# Patient Record
Sex: Male | Born: 1970 | Race: White | Hispanic: No | Marital: Single
Health system: Midwestern US, Community
[De-identification: ages and names within clinical notes are randomized; demographics above are authoritative.]

## PROBLEM LIST (undated history)

## (undated) DIAGNOSIS — S53106A Unspecified dislocation of unspecified ulnohumeral joint, initial encounter: Secondary | ICD-10-CM

## (undated) DIAGNOSIS — S42009A Fracture of unspecified part of unspecified clavicle, initial encounter for closed fracture: Secondary | ICD-10-CM

## (undated) HISTORY — PX: OTHER SURGICAL HISTORY: SHX169

---

## 2012-07-12 ENCOUNTER — Ambulatory Visit (INDEPENDENT_AMBULATORY_CARE_PROVIDER_SITE_OTHER): Payer: BC Managed Care – PPO | Admitting: Emergency Medicine

## 2012-07-12 ENCOUNTER — Telehealth: Payer: Self-pay

## 2012-07-12 VITALS — BP 126/72 | HR 89 | Temp 100.0°F | Resp 16 | Ht 73.0 in | Wt 226.0 lb

## 2012-07-12 DIAGNOSIS — A088 Other specified intestinal infections: Secondary | ICD-10-CM

## 2012-07-12 DIAGNOSIS — J209 Acute bronchitis, unspecified: Secondary | ICD-10-CM

## 2012-07-12 MED ORDER — HYDROCOD POLST-CHLORPHEN POLST 10-8 MG/5ML PO LQCR: 5.0000 mL | Freq: Two times a day (BID) | ORAL | Status: DC | PRN

## 2012-07-12 MED ORDER — AZITHROMYCIN 250 MG PO TABS: ORAL_TABLET | ORAL | Status: DC

## 2012-07-12 NOTE — Patient Instructions (Addendum)

## 2012-07-12 NOTE — Telephone Encounter (Signed)
Pharmacist from [facility] called and reported they did not get the Rx for Abx sent. I gave him Rx info over the phone because the Rx had been sent to the [facility] on [address] in error. I am correcting it in EPIC.

## 2012-07-12 NOTE — Progress Notes (Signed)
[facility] [address], [place] [address] [phone]  Date:  07/12/2012   Name:  Dennis Eaton   DOB:  06-Apr-1970   MRN:  [id number]  PCP:  No PCP Per Patient    Chief Complaint: Nasal Congestion, Diarrhea and Cough   History of Present Illness:  Dennis Eaton is a 42 y.o. very pleasant male patient who presents with the following:  Ill for a week with post nasal drainage and a cough productive of purulent sputum.  No wheezing or shortness of breath.  No fever but chilled feeling. Has diarrhea that ended last night since Sunday.  No nausea or vomiting. No ill contacts. The patient has no complaint of blood, mucous, or pus in her stools. Tolerating extra liquids.  No improvement with over the counter medications or other home remedies. Denies other complaint or health concern today.   There is no problem list on file for this patient.   History reviewed. No pertinent past medical history.  History reviewed. No pertinent past surgical history.  History  Substance Use Topics  . Smoking status: Never Smoker   . Smokeless tobacco: Not on file  . Alcohol Use: Yes    History reviewed. No pertinent family history.  No Known Allergies  Medication list has been reviewed and updated.  No current outpatient prescriptions on file prior to visit.   No current facility-administered medications on file prior to visit.    Review of Systems:  As per HPI, otherwise negative.    Physical Examination: Filed Vitals:   07/12/12 1504  BP: 126/72  Pulse: 89  Temp: 100 F (37.8 C)  Resp: 16   Filed Vitals:   07/12/12 1504  Height: 6\' 1"  (1.854 m)  Weight: 226 lb (102.513 kg)   Body mass index is 29.82 kg/(m^2). Ideal Body Weight: Weight in (lb) to have BMI = 25: 189.1  GEN: WDWN, NAD, Non-toxic, A & O x 3 HEENT: Atraumatic, Normocephalic. Neck supple. No masses, No LAD. Ears and Nose: No external deformity. CV: RRR, No M/G/R. No JVD. No thrill. No  extra heart sounds. PULM: CTA B, no wheezes, crackles, rhonchi. No retractions. No resp. distress. No accessory muscle use. ABD: S, NT, ND, +BS. No rebound. No HSM. EXTR: No c/c/e NEURO Normal gait.  PSYCH: Normally interactive. Conversant. Not depressed or anxious appearing.  Calm demeanor.    Assessment and Plan: Bronchitis Viral gastroenteritis zpak Gastroenteritis  Signed,  Phillips Odor, MD

## 2012-07-18 ENCOUNTER — Ambulatory Visit (INDEPENDENT_AMBULATORY_CARE_PROVIDER_SITE_OTHER): Payer: BC Managed Care – PPO | Admitting: Emergency Medicine

## 2012-07-18 VITALS — BP 114/72 | HR 64 | Temp 98.4°F | Resp 16 | Ht 73.0 in | Wt 227.0 lb

## 2012-07-18 DIAGNOSIS — J209 Acute bronchitis, unspecified: Secondary | ICD-10-CM

## 2012-07-18 NOTE — Progress Notes (Signed)
[facility] [address], [place] [address] [phone]  Date:  07/18/2012   Name:  Dennis Eaton   DOB:  [birth year]   MRN:  [id number]  PCP:  No PCP Per Patient    Chief Complaint: Follow-up, Hernia and Allergies   History of Present Illness:  Dennis Eaton is a 42 y.o. very pleasant male patient who presents with the following:  Seen a week ago and treated with zithromax and tussionex.  Finished the antibiotic and concerned that he still has a cough.  No further fever or chills. No wheezing or shortness of breath.  No nausea or vomiting.  No stool change.  Cough not productive and improved with meds.  Denies other complaint or health concern today.   There is no problem list on file for this patient.   No past medical history on file.  No past surgical history on file.  History  Substance Use Topics  . Smoking status: Never Smoker   . Smokeless tobacco: Not on file  . Alcohol Use: Yes    No family history on file.  No Known Allergies  Medication list has been reviewed and updated.  Current Outpatient Prescriptions on File Prior to Visit  Medication Sig Dispense Refill  . chlorpheniramine-HYDROcodone (TUSSIONEX PENNKINETIC ER) 10-8 MG/5ML LQCR Take 5 mLs by mouth every 12 (twelve) hours as needed.  60 mL  0  . azithromycin (ZITHROMAX) 250 MG tablet Take 2 tabs PO x 1 dose, then 1 tab PO QD x 4 days  6 tablet  0   No current facility-administered medications on file prior to visit.    Review of Systems:  As per HPI, otherwise negative.    Physical Examination: Filed Vitals:   07/18/12 1328  BP: 114/72  Pulse: 64  Temp: 98.4 F (36.9 C)  Resp: 16   Filed Vitals:   07/18/12 1328  Height: 6\' 1"  (1.854 m)  Weight: 227 lb (102.967 kg)   Body mass index is 29.96 kg/(m^2). Ideal Body Weight: Weight in (lb) to have BMI = 25: 189.1  GEN: WDWN, NAD, Non-toxic, A & O x 3 HEENT: Atraumatic, Normocephalic. Neck supple. No masses, No  LAD. Ears and Nose: No external deformity. CV: RRR, No M/G/R. No JVD. No thrill. No extra heart sounds. PULM: CTA B, no wheezes, crackles, rhonchi. No retractions. No resp. distress. No accessory muscle use. ABD: S, NT, ND, +BS. No rebound. No HSM. EXTR: No c/c/e NEURO Normal gait.  PSYCH: Normally interactive. Conversant. Not depressed or anxious appearing.  Calm demeanor.    Assessment and Plan: Bronchitis Continue medication Follow up as needed   Signed,  Phillips Odor, MD

## 2012-12-05 ENCOUNTER — Ambulatory Visit (INDEPENDENT_AMBULATORY_CARE_PROVIDER_SITE_OTHER): Payer: BC Managed Care – PPO | Admitting: Sports Medicine

## 2012-12-05 VITALS — BP 137/85 | Ht 73.5 in | Wt 228.0 lb

## 2012-12-05 DIAGNOSIS — M79662 Pain in left lower leg: Secondary | ICD-10-CM

## 2012-12-05 DIAGNOSIS — M79669 Pain in unspecified lower leg: Secondary | ICD-10-CM | POA: Insufficient documentation

## 2012-12-05 DIAGNOSIS — M79609 Pain in unspecified limb: Secondary | ICD-10-CM

## 2012-12-05 NOTE — Progress Notes (Signed)
  Subjective:    Patient ID: Dennis Eaton, male    DOB: [birth year], 42 y.o.   MRN: [id number]  HPI  Pt presents to clinic for evaluation of Lt AT and calf pain since the end of July.  Hx of calf injury 10 years ago. Running 3 times per week 3 miles. Works at an office type job.  Running course sometimes hilly  No swelling    Review of Systems     Objective:   Physical Exam  No direct ttp bilat calf Calves look symmetrical Leg lengths equal Alignment good   Mildly increased arch bilat Slight hallux valgus bilat great toes Slight Morton's change bilat Good post tib function bilat Great toe motion normal bilat   Running gait Neutral Midfoot strike Less push off on lt       Assessment & Plan:

## 2012-12-05 NOTE — Assessment & Plan Note (Signed)
HEP  Heel lifts  Compression sleeve  Running gait is neutral and I don't think this is issue  May need to continue using sports insoles and heel lifts

## 2012-12-05 NOTE — Patient Instructions (Addendum)
Please do suggested exercises at least 3 times per week  Ok to ease back into running  Use calf sleeve for activity and 30 min - 1 hour afterwards  Use heel lifts in running shoes  Please follow up in 6 weeks  Thank you for seeing [place] today!

## 2013-01-17 ENCOUNTER — Ambulatory Visit (INDEPENDENT_AMBULATORY_CARE_PROVIDER_SITE_OTHER): Payer: BC Managed Care – PPO | Admitting: Sports Medicine

## 2013-01-17 ENCOUNTER — Encounter: Payer: Self-pay | Admitting: Sports Medicine

## 2013-01-17 VITALS — BP 135/83 | HR 81 | Ht 73.5 in | Wt 214.0 lb

## 2013-01-17 DIAGNOSIS — M79609 Pain in unspecified limb: Secondary | ICD-10-CM

## 2013-01-17 DIAGNOSIS — S86112A Strain of other muscle(s) and tendon(s) of posterior muscle group at lower leg level, left leg, initial encounter: Secondary | ICD-10-CM

## 2013-01-17 DIAGNOSIS — M79662 Pain in left lower leg: Secondary | ICD-10-CM

## 2013-01-17 DIAGNOSIS — S838X9A Sprain of other specified parts of unspecified knee, initial encounter: Secondary | ICD-10-CM

## 2013-01-17 MED ORDER — NITROGLYCERIN 0.2 MG/HR TD PT24: MEDICATED_PATCH | TRANSDERMAL | Status: DC

## 2013-01-17 NOTE — Assessment & Plan Note (Signed)
Primarily only with running  No sign of DVT

## 2013-01-17 NOTE — Patient Instructions (Addendum)
Dennis Eaton, it was nice seeing you again today.  Please continue to do the one leg heel lifts on the stairs with your left leg. (3 sets of 15 reps).  As well, we will start you on the nitroglycerin protocol to help increase healing in that area.    Nitroglycerin Protocol   Apply 1/4 nitroglycerin patch to affected area daily.  Change position of patch within the affected area every 24 hours.  You may experience a headache during the first 1-2 weeks of using the patch, these should subside.  If you experience headaches after beginning nitroglycerin patch treatment, you may take your preferred over the counter pain reliever.  Another side effect of the nitroglycerin patch is skin irritation or rash related to patch adhesive.  Please notify our office if you develop more severe headaches or rash, and stop the patch.  Tendon healing with nitroglycerin patch may require 12 to 24 weeks depending on the extent of injury.  Men should not use if taking Viagra, Cialis, or Levitra.   Do not use if you have migraines or rosacea.

## 2013-01-17 NOTE — Progress Notes (Signed)
Dennis Eaton is a 42 y.o. male who presents today for left calf pain f/u.  Pt was originally seen by Dr. Darrick Penna on 12/05/12 and dx with calf strain at that time.  He was given a compression sleeve, 3/16 heel lifts B/L, and eccentric calf exercises.  Since his visit, pt had improvement over about a two week period prior to going out on a track and having gradual pain, sharp, that presented after running one lap around the track.  Since that point, he has not been able to run, perform the exercises.  Pain is only with forceful plantarflexion but otherwise he does not notice it.  Denies any popping sensation or hearing this when he was running.  Denies any paresthesias down his leg as well.    Current Outpatient Prescriptions on File Prior to Visit  Medication Sig Dispense Refill  . azithromycin (ZITHROMAX) 250 MG tablet Take 2 tabs PO x 1 dose, then 1 tab PO QD x 4 days  6 tablet  0  . chlorpheniramine-HYDROcodone (TUSSIONEX PENNKINETIC ER) 10-8 MG/5ML LQCR Take 5 mLs by mouth every 12 (twelve) hours as needed.  60 mL  0   No current facility-administered medications on file prior to visit.    ROS: Per HPI.  All other systems reviewed and are negative.   Physical Exam Filed Vitals:   01/17/13 0927  BP: 135/83  Pulse: 81    Physical Examination: General appearance - alert, well appearing, and in no distress Left Calf - No gross deformity or edema.  No direct TTP, 5/5 MS with negative Thompson test, + 2 DTR LE  Distally neurovascular intact   MSK [place] L calf, long and short axis obtained - hypoechoic 1 x 1 cm region around the gastroc/soleus junction with the achilles around the posterior aspect.  Achilles visualized and intact at the calcaneal junction

## 2013-01-17 NOTE — Assessment & Plan Note (Addendum)
Pt with visualized hypoechoic 1 x 1 cm region around the gastroc/soleus junction with the achilles around the posterior aspect.  He has been using the compression sleeve. Until 2 weeks ago he was performing the calf eccentric exercises, and ice after activity.  We will restart these along with starting the NTG protocol.  Will f/u in around 6 weeks to see how he is doing and consider performing repeat US to evaluate for improvement.    We added a higher heel lift to his insoles which look good.

## 2013-02-20 ENCOUNTER — Encounter: Payer: Self-pay | Admitting: Sports Medicine

## 2013-02-20 ENCOUNTER — Ambulatory Visit (INDEPENDENT_AMBULATORY_CARE_PROVIDER_SITE_OTHER): Payer: BC Managed Care – PPO | Admitting: Sports Medicine

## 2013-02-20 VITALS — BP 117/72 | Ht 73.5 in | Wt 214.0 lb

## 2013-02-20 DIAGNOSIS — M79662 Pain in left lower leg: Secondary | ICD-10-CM

## 2013-02-20 DIAGNOSIS — M79609 Pain in unspecified limb: Secondary | ICD-10-CM

## 2013-02-20 NOTE — Patient Instructions (Signed)
-  Continue Nitroglycerin patches over the mid calf at the point of maximum pain - Continue calf raises with leg straight and bend 45 degree continue 3 sets of 15 reps and advance weight as tolerated - Start running for 20 minutes total breakup into 5-10 minute intervals. Start 1-2 times per week and increase as tolerated

## 2013-02-20 NOTE — Progress Notes (Signed)
Patient ID: Dennis Eaton, male   DOB: 10-03-1970, 42 y.o.   MRN: [id number]  SUBJECTIVE:  Dennis Eaton is a 42 y.o. male who presents today for f/u left calf pain. Was previously dx with calf strain. He was given a compression sleeve, 3/16 heel lifts B/L, and eccentric calf exercises. He also was started on the nitroglycerin protocol over the site of medial gastrocnemius tear seen on [place] previously.  Since his visit, pt had improvement over. Since that point, he has not tried to return to run, but has been able to increase is reps on heel raises. Pain is only with forceful plantarflexion but otherwise he does not notice it. Denies any popping sensation or hearing this when he was running. Denies any paresthesias down his leg as well.   ROS: negative except what is stated in HIP  OBJECTIVE: BP 117/72 wt 214 ht 6' 1.5'' Physical Examination: General appearance - alert, well appearing, and in no distress  Left Calf - No gross deformity or edema. Mild direct TTP over the medial gastrocnemius junction, 5/5 MS with negative Thompson test, + 2 DTR LE  Distally neurovascular intact  MSK [place] L calf, long and short axis obtained - resolving of the hypoechoic region around the gastroc/soleus junction of the medial gastrocnemius head long the lateral midline. Achilles visualized and intact at the calcaneal junction    ASSESSMENT: Medial gastrocnemius muscle tear along the lateral midline - improved  PLAN: - Continue Nitroglycerin protocol - Continue calf raises with leg straight and bend 45 degree continue 3 sets of 15 reps and advance weight as tolerated - Start running for 20 minutes total breakup into 5-10 minute intervals. Start 1-2 times per week and increase as tolerated - Follow up in 6-8 weeks

## 2014-04-20 ENCOUNTER — Emergency Department (HOSPITAL_COMMUNITY): Payer: BLUE CROSS/BLUE SHIELD

## 2014-04-20 ENCOUNTER — Emergency Department (HOSPITAL_COMMUNITY)
Admission: EM | Admit: 2014-04-20 | Discharge: 2014-04-20 | Disposition: A | Discharge status: Home / Self Care | Payer: BLUE CROSS/BLUE SHIELD | Attending: Emergency Medicine | Admitting: Emergency Medicine

## 2014-04-20 ENCOUNTER — Encounter (HOSPITAL_COMMUNITY): Payer: Self-pay | Admitting: Emergency Medicine

## 2014-04-20 DIAGNOSIS — W19XXXA Unspecified fall, initial encounter: Secondary | ICD-10-CM

## 2014-04-20 DIAGNOSIS — Y9285 Railroad track as the place of occurrence of the external cause: Secondary | ICD-10-CM | POA: Diagnosis not present

## 2014-04-20 DIAGNOSIS — Y9355 Activity, bike riding: Secondary | ICD-10-CM | POA: Diagnosis not present

## 2014-04-20 DIAGNOSIS — Y998 Other external cause status: Secondary | ICD-10-CM | POA: Insufficient documentation

## 2014-04-20 DIAGNOSIS — IMO0001 Reserved for inherently not codable concepts without codable children: Secondary | ICD-10-CM

## 2014-04-20 DIAGNOSIS — S42401A Unspecified fracture of lower end of right humerus, initial encounter for closed fracture: Secondary | ICD-10-CM | POA: Diagnosis not present

## 2014-04-20 DIAGNOSIS — Z79899 Other long term (current) drug therapy: Secondary | ICD-10-CM | POA: Diagnosis not present

## 2014-04-20 DIAGNOSIS — S59901A Unspecified injury of right elbow, initial encounter: Secondary | ICD-10-CM | POA: Diagnosis present

## 2014-04-20 LAB — CBC WITH DIFFERENTIAL/PLATELET
BASOS PCT: 1 % (ref 0–1)
Basophils Absolute: 0 10*3/uL (ref 0.0–0.1)
Eosinophils Absolute: 0.1 10*3/uL (ref 0.0–0.7)
Eosinophils Relative: 1 % (ref 0–5)
HCT: 42.1 % (ref 39.0–52.0)
HEMOGLOBIN: 13.8 g/dL (ref 13.0–17.0)
Lymphocytes Relative: 18 % (ref 12–46)
Lymphs Abs: 1.3 10*3/uL (ref 0.7–4.0)
MCH: 30.3 pg (ref 26.0–34.0)
MCHC: 32.8 g/dL (ref 30.0–36.0)
MCV: 92.3 fL (ref 78.0–100.0)
MONOS PCT: 9 % (ref 3–12)
Monocytes Absolute: 0.6 10*3/uL (ref 0.1–1.0)
NEUTROS ABS: 5.1 10*3/uL (ref 1.7–7.7)
Neutrophils Relative %: 71 % (ref 43–77)
PLATELETS: 204 10*3/uL (ref 150–400)
RBC: 4.56 MIL/uL (ref 4.22–5.81)
RDW: 13.7 % (ref 11.5–15.5)
WBC: 7.1 10*3/uL (ref 4.0–10.5)

## 2014-04-20 LAB — BRAIN NATRIURETIC PEPTIDE: B NATRIURETIC PEPTIDE 5: 16.9 pg/mL (ref 0.0–100.0)

## 2014-04-20 MED ORDER — HYDROMORPHONE HCL 1 MG/ML IJ SOLN
1.0000 mg | Freq: Once | INTRAMUSCULAR | Status: AC
  Administered 2014-04-20: 1 mg via INTRAVENOUS
  Filled 2014-04-20: qty 1

## 2014-04-20 MED ORDER — KETAMINE HCL 10 MG/ML IJ SOLN
INTRAMUSCULAR | Status: AC | PRN
  Administered 2014-04-20: 200 mg via INTRAVENOUS

## 2014-04-20 MED ORDER — HYDROCODONE-ACETAMINOPHEN 5-325 MG PO TABS: 1.0000 | ORAL_TABLET | Freq: Four times a day (QID) | ORAL | Status: DC | PRN

## 2014-04-20 MED ORDER — SODIUM CHLORIDE 0.9 % IV BOLUS (SEPSIS)
1000.0000 mL | Freq: Once | INTRAVENOUS | Status: AC
  Administered 2014-04-20: 1000 mL via INTRAVENOUS

## 2014-04-20 MED ORDER — KETAMINE HCL 50 MG/ML IJ SOLN
200.0000 mg | Freq: Once | INTRAMUSCULAR | Status: DC
  Filled 2014-04-20: qty 4

## 2014-04-20 NOTE — ED Notes (Signed)
Bed: WA21 Expected date:  Expected time:  Means of arrival:  Comments: ? Elbow dislocation
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
## 2014-04-20 NOTE — ED Notes (Signed)
Ketamine wasted in sink with Patti, Cn present

## 2014-04-20 NOTE — Discharge Instructions (Signed)
You are noted to have a fracture and dislocation of the right elbow. We were able to reduce the elbow dislocation, however, you will have to see the Orthopedic surgeon to determine if surgery of the elbow will be needed or not. Take the meds prescribed.  Return to the Er immediately if there is severe pain in your arm.   Cast or Splint Care Casts and splints support injured limbs and keep bones from moving while they heal.  HOME CARE  Keep the cast or splint uncovered during the drying period.  A plaster cast can take 24 to 48 hours to dry.  A fiberglass cast will dry in less than 1 hour.  Do not rest the cast on anything harder than a pillow for 24 hours.  Do not put weight on your injured limb. Do not put pressure on the cast. Wait for your doctor's approval.  Keep the cast or splint dry.  Cover the cast or splint with a plastic bag during baths or wet weather.  If you have a cast over your chest and belly (trunk), take sponge baths until the cast is taken off.  If your cast gets wet, dry it with a towel or blow dryer. Use the cool setting on the blow dryer.  Keep your cast or splint clean. Wash a dirty cast with a damp cloth.  Do not put any objects under your cast or splint.  Do not scratch the skin under the cast with an object. If itching is a problem, use a blow dryer on a cool setting over the itchy area.  Do not trim or cut your cast.  Do not take out the padding from inside your cast.  Exercise your joints near the cast as told by your doctor.  Raise (elevate) your injured limb on 1 or 2 pillows for the first 1 to 3 days. GET HELP IF:  Your cast or splint cracks.  Your cast or splint is too tight or too loose.  You itch badly under the cast.  Your cast gets wet or has a soft spot.  You have a bad smell coming from the cast.  You get an object stuck under the cast.  Your skin around the cast becomes red or sore.  You have new or more pain after the  cast is put on. GET HELP RIGHT AWAY IF:  You have fluid leaking through the cast.  You cannot move your fingers or toes.  Your fingers or toes turn blue or white or are cool, painful, or puffy (swollen).  You have tingling or lose feeling (numbness) around the injured area.  You have bad pain or pressure under the cast.  You have trouble breathing or have shortness of breath.  You have chest pain. Document Released: 07/22/2010 Document Revised: 11/22/2012 Document Reviewed: 09/28/2012 [facility] Patient Information 2015 [place], [place]. This information is not intended to replace advice given to you by your health care provider. Make sure you discuss any questions you have with your health care provider.  Elbow Dislocation Elbow dislocation is the displacement of the bones that form the elbow joint. Three bones come together to form the elbow. The humerus is the bone in the upper arm. The radius and ulna are the 2 bones in the forearm that form the lower part of the elbow. The elbow is held in place by very strong, fibrous tissues (ligaments) that connect the bones to each other. CAUSES Elbow dislocations are not common. Typically, they occur  when a person falls forward with hands and elbows outstretched. The force of the impact is sent to the elbow. Usually, there is a twisting motion in this force. Elbow dislocations also happen during car crashes when passengers reach out to brace themselves during the impact. RISK FACTORS Although dislocation of the elbow can happen to anyone, some people are at greater risk than others. People at increased risk of elbow dislocation include:  People born with greater looseness in their ligaments.  People born with an ulna bone that has a shallow groove for the elbow hinge joint. SYMPTOMS Symptoms of a complete elbow dislocation usually are obvious. They include extreme pain and the appearance of a deformed arm.  Symptoms of a partial dislocation may  not be obvious. Your elbow may move somewhat, but you may have pain and swelling. Also, there will likely be bruising on the inside and outside of your elbow where ligaments have been stretched or torn.  DIAGNOSIS  To diagnose elbow dislocation, your caregiver will perform a physical exam. During this exam, your caregiver will check your arm for tenderness, swelling, and deformity. The skin around your arm and the circulation in your arm also will be checked. Your pulse will be checked at your wrist. If your artery is injured during dislocation, your hand will be cool to the touch and may be white or purple in color. Your caregiver also may check your arm and your ability to move your wrist and fingers to see if you had any damage to your nerves during dislocation. An X-ray exam also may be done to determine if there is bone injury. Results of an X-ray exam can help show the direction of the dislocation. If you have a simple dislocation, there is no major bone injury. If you have a complex dislocation, you may have broken bones (fractures) associated with the ligament injuries. TREATMENT For a simple elbow dislocation, your bones can usually be realigned in a procedure called a reduction. This is a treatment in which your bones are manually moved back into place either with the use of numbing medicine (regional anesthetic) around your elbow or medicine to make you sleep (general anesthetic). Then your elbow is kept immobile with a sling or a splint for 2 to 3 weeks. This is followed with physical therapy to help your joint move again. Complex elbow dislocation may require surgery to restore joint alignment and repair ligaments. After surgery, your elbow may be protected with an external hinge. This device keeps your elbow from dislocating again while motion exercises are done. Additional surgery may be needed to repair any injuries to blood vessels and nerves or bones and ligaments or to relieve pressure from  excessive swelling around the muscles. HOME CARE INSTRUCTIONS The following measures can help to reduce pain and hasten the healing process:  Rest your injured joint. Do not move it. Avoid activities similar to the one that caused your injury.  Exercise your hand and fingers as instructed by your caregiver.  Apply ice to your injured joint for 1 to 2 days after your reduction or as directed by your caregiver. Applying ice helps to reduce inflammation and pain.  Put ice in a plastic bag.  Place a towel between your skin and the bag.  Leave the ice on for 15 to 20 minutes at a time, every couple of hours while you are awake.  Elevate your arm above your heart and move your wrist and fingers as instructed by your caregiver  to help limit swelling.  Take over-the-counter or prescription medicines for pain as directed by your caregiver. SEEK IMMEDIATE MEDICAL CARE IF:  Your splint becomes damaged.  You have an external hinge and it becomes loose or will not move.  You have an external hinge and you develop drainage around the pins.  Your pain becomes worse rather than better.  You lose feeling in your hand or fingers. MAKE SURE YOU:  Understand these instructions.  Will watch your condition.  Will get help right away if you are not doing well or get worse. Document Released: 03/16/2001 Document Revised: 06/14/2011 Document Reviewed: 08/20/2010 [facility] Patient Information 2015 [place], [place]. This information is not intended to replace advice given to you by your health care provider. Make sure you discuss any questions you have with your health care provider.  Elbow Fracture, Simple A fracture is a break in one of the bones.When fractures are not displaced or separated, they may be treated with only a sling or splint. The sling or splint may only be required for two to three weeks. In these cases, often the elbow is put through early range of motion exercises to prevent the elbow  from getting stiff. DIAGNOSIS  The diagnosis (learning what is wrong) of a fractured elbow is made by x-ray. These may be required before and after the elbow is put into a splint or cast. X-rays are taken after to make sure the bone pieces have not moved. HOME CARE INSTRUCTIONS   Only take over-the-counter or prescription medicines for pain, discomfort, or fever as directed by your caregiver.  If you have a splint held on with an elastic wrap and your hand or fingers become numb or cold and blue, loosen the wrap and reapply more loosely. See your caregiver if there is no relief.  You may use ice for twenty minutes, four times per day, for the first two to three days.  Use your elbow as directed.  See your caregiver as directed. It is very important to keep all follow-up referrals and appointments in order to avoid any long-term problems with your elbow including chronic pain or stiffness. SEEK IMMEDIATE MEDICAL CARE IF:   There is swelling or increasing pain in elbow.  You begin to lose feeling or experience numbness or tingling in your hand or fingers.  You develop swelling of the hand and fingers.  You get a cold or blue hand or fingers on affected side. MAKE SURE YOU:   Understand these instructions.  Will watch your condition.  Will get help right away if you are not doing well or get worse. Document Released: 03/16/2001 Document Revised: 06/14/2011 Document Reviewed: 02/04/2009 [facility] Patient Information 2015 [place], [place]. This information is not intended to replace advice given to you by your health care provider. Make sure you discuss any questions you have with your health care provider.

## 2014-04-20 NOTE — ED Notes (Signed)
6 ml/300mg  Ketamine wasted in sink by [place], RCharity fundraiser
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
## 2014-04-20 NOTE — ED Notes (Signed)
MD bedside
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
## 2015-10-29 ENCOUNTER — Other Ambulatory Visit: Payer: Self-pay | Admitting: Orthopedic Surgery

## 2015-11-03 ENCOUNTER — Encounter (HOSPITAL_COMMUNITY): Payer: Self-pay

## 2015-11-03 ENCOUNTER — Encounter (HOSPITAL_COMMUNITY)
Admission: RE | Admit: 2015-11-03 | Discharge: 2015-11-03 | Disposition: A | Discharge status: Home / Self Care | Payer: BLUE CROSS/BLUE SHIELD | Source: Ambulatory Visit | Attending: Orthopedic Surgery | Admitting: Orthopedic Surgery

## 2015-11-03 DIAGNOSIS — S42001A Fracture of unspecified part of right clavicle, initial encounter for closed fracture: Secondary | ICD-10-CM | POA: Diagnosis present

## 2015-11-03 DIAGNOSIS — X58XXXA Exposure to other specified factors, initial encounter: Secondary | ICD-10-CM | POA: Diagnosis not present

## 2015-11-03 HISTORY — DX: Unspecified dislocation of unspecified ulnohumeral joint, initial encounter: S53.106A

## 2015-11-03 HISTORY — DX: Fracture of unspecified part of unspecified clavicle, initial encounter for closed fracture: S42.009A

## 2015-11-03 LAB — BASIC METABOLIC PANEL
ANION GAP: 4 — AB (ref 5–15)
BUN: 18 mg/dL (ref 6–20)
CALCIUM: 9.3 mg/dL (ref 8.9–10.3)
CO2: 27 mmol/L (ref 22–32)
Chloride: 107 mmol/L (ref 101–111)
Creatinine, Ser: 1.2 mg/dL (ref 0.61–1.24)
GFR calc non Af Amer: >60 mL/min (ref >60)
Glucose, Bld: 102 mg/dL — ABNORMAL HIGH (ref 65–99)
Potassium: 4.1 mmol/L (ref 3.5–5.1)
SODIUM: 138 mmol/L (ref 135–145)

## 2015-11-03 LAB — CBC
HCT: 42.3 % (ref 39.0–52.0)
HEMOGLOBIN: 13.7 g/dL (ref 13.0–17.0)
MCH: 29.7 pg (ref 26.0–34.0)
MCHC: 32.4 g/dL (ref 30.0–36.0)
MCV: 91.8 fL (ref 78.0–100.0)
PLATELETS: 237 10*3/uL (ref 150–400)
RBC: 4.61 MIL/uL (ref 4.22–5.81)
RDW: 13 % (ref 11.5–15.5)
WBC: 6.5 10*3/uL (ref 4.0–10.5)

## 2015-11-03 NOTE — Pre-Procedure Instructions (Signed)
Dennis Eaton  11/03/2015      Dennis Eaton [facility] [address] [place] [address] [place] [address] Phone: [phone] Fax: [phone]  Dennis Eaton [facility] [address] [place] - [address] [place] [address] Phone: [phone] Fax: [phone]    Your procedure is scheduled on Tuesday, November 04, 2015  Report to [facility] Admitting at 2:30 P.M.  Call this number if you have problems the morning of surgery:  [phone]   Remember:  Do not eat food or drink liquids after midnight.   Take these medicines the morning of surgery with A SIP OF WATER : None  Stop taking Aspirin,vitamins, fish oil and herbal medications. Do not take any NSAIDs ie: Ibuprofen, Advil, Naproxen, BC and Goody Powder or any medication containing Aspirin; stop now.   Do not wear jewelry.  Do not wear lotions, powders, or colonge.  You may not wear deoderant.   Men may shave face and neck.  Do not bring valuables to the hospital.  [facility] is not responsible for any belongings or valuables.  Contacts, dentures or bridgework may not be worn into surgery.  Leave your suitcase in the car.  After surgery it may be brought to your room.  For patients admitted to the hospital, discharge time will be determined by your treatment team.  Patients discharged the day of surgery will not be allowed to drive home.   Name and phone number of your driver:   Special instructions:  [facility] - Preparing for Surgery  Before surgery, you can play an important role.  Because skin is not sterile, your skin needs to be as free of germs as possible.  You can reduce the number of germs on you skin by washing with CHG (chlorahexidine gluconate) soap before surgery.  CHG is an antiseptic cleaner which kills germs and bonds with the skin to continue killing germs even after washing.  Please DO NOT use if you have an allergy to  CHG or antibacterial soaps.  If your skin becomes reddened/irritated stop using the CHG and inform your nurse when you arrive at Short Stay.  Do not shave (including legs and underarms) for at least 48 hours prior to the first CHG shower.  You may shave your face.  Please follow these instructions carefully:   1.  Shower with CHG Soap the night before surgery and the morning of Surgery.  2.  If you choose to wash your hair, wash your hair first as usual with your normal shampoo.  3.  After you shampoo, rinse your hair and body thoroughly to remove the Shampoo.  4.  Use CHG as you would any other liquid soap.  You can apply chg directly  to the skin and wash gently with scrungie or a clean washcloth.  5.  Apply the CHG Soap to your body ONLY FROM THE NECK DOWN.  Do not use on open wounds or open sores.  Avoid contact with your eyes, ears, mouth and genitals (private parts).  Wash genitals (private parts) with your normal soap.  6.  Wash thoroughly, paying special attention to the area where your surgery will be performed.  7.  Thoroughly rinse your body with warm water from the neck down.  8.  DO NOT shower/wash with your normal soap after using and rinsing off the CHG Soap.  9.  Pat yourself dry with a  clean towel.            10.  Wear clean pajamas.            11.  Place clean sheets on your bed the night of your first shower and do not sleep with pets.  Day of Surgery  Do not apply any lotions/deodorants the morning of surgery.  Please wear clean clothes to the hospital/surgery center.  Please read over the following fact sheets that you were given. Pain Booklet, Coughing and Deep Breathing, MRSA Information and Surgical Site Infection Prevention

## 2015-11-03 NOTE — Progress Notes (Signed)
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  He stated that he has the records at work and will go there and try to get them faxed over to [place] today.  Will follow up with patient this afternoon on these records    Patient denies shortness of breath, fever, cough and chest pain at PAT appointment

## 2015-11-03 NOTE — Progress Notes (Signed)
Anesthesia Chart Review: Patient is a 45 year old male scheduled for ORIF of right clavicular fracture on 11/04/15 by Dr. August Saucer.   History includes non-smoker. 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  He works for [employer]. He was able to get the Occupational Nurse there to fax records from [facility]. He was seen there on 07/13/11 by Dr. Tyrone Nine for evaluation of an abnormal EKG that showed "borderline first-degree AV block, left ward axis and the possibility of an old inferior infarction, age undetermined." EKG at that office showed "normal sinus rhythm, but normal AV conduction on QRS configuration of an incomplete and more likely innocent right bundle branch block.." Patient underwent an echocardiogram and treadmill test. According to 07/13/11 office note, "The echocardiogram reveals all chamber dimensions to be within normal limits. No significant valvular pathology. No signs of pericardial effusion, wall motion in all the chamber normal throughout. The treadmill test was performed following a modified Bruce protocol and the patient achieved 12.9 METs of exercise, which is an excellent aerobic functional capacity. No arrhythmias or signs of effort induced ischemia were seen. Again during the performance of the treadmill, the patient's electrocardigram continued to show the presence of borderline incomplete innocent right bundle branch block." Dr. Nicholes Mango stated, "...the cardiovascular examination of this patient should otherwise considered to be within normal limites at this times. The finders of the incomplete right bundle branch block by itself do not represent pathology."  11/03/15 EKG: SR with first degree AV block, possible LAE, right BBB. Right BBB pattern is more obvious in V1 with QRS when compared to 06/29/11 tracing received from [facility].  Preoperative labs noted.   Anticipate that he can proceed as planned.  Velna Ochs [facility] Short Stay  Center/Anesthesiology Phone [phone] 11/03/2015 3:16 PM

## 2015-11-04 ENCOUNTER — Ambulatory Visit (HOSPITAL_COMMUNITY): Payer: BLUE CROSS/BLUE SHIELD

## 2015-11-04 ENCOUNTER — Encounter (HOSPITAL_COMMUNITY): Payer: Self-pay | Admitting: Anesthesiology

## 2015-11-04 ENCOUNTER — Ambulatory Visit (HOSPITAL_COMMUNITY): Payer: BLUE CROSS/BLUE SHIELD | Admitting: Vascular Surgery

## 2015-11-04 ENCOUNTER — Ambulatory Visit (HOSPITAL_COMMUNITY)
Admission: RE | Admit: 2015-11-04 | Discharge: 2015-11-04 | Disposition: A | Discharge status: Home / Self Care | Payer: BLUE CROSS/BLUE SHIELD | Source: Ambulatory Visit | Attending: Orthopedic Surgery | Admitting: Orthopedic Surgery

## 2015-11-04 ENCOUNTER — Encounter (HOSPITAL_COMMUNITY)
Admission: RE | Disposition: A | Discharge status: Home / Self Care | Payer: Self-pay | Source: Ambulatory Visit | Attending: Orthopedic Surgery

## 2015-11-04 ENCOUNTER — Ambulatory Visit (HOSPITAL_COMMUNITY): Payer: BLUE CROSS/BLUE SHIELD | Admitting: Anesthesiology

## 2015-11-04 DIAGNOSIS — X58XXXA Exposure to other specified factors, initial encounter: Secondary | ICD-10-CM | POA: Insufficient documentation

## 2015-11-04 DIAGNOSIS — S42001A Fracture of unspecified part of right clavicle, initial encounter for closed fracture: Secondary | ICD-10-CM | POA: Insufficient documentation

## 2015-11-04 HISTORY — PX: ORIF CLAVICULAR FRACTURE: SHX5055

## 2015-11-04 SURGERY — OPEN REDUCTION INTERNAL FIXATION (ORIF) CLAVICULAR FRACTURE
Anesthesia: General | Site: Shoulder | Laterality: Right

## 2015-11-04 MED ORDER — MORPHINE SULFATE (PF) 4 MG/ML IV SOLN
INTRAVENOUS | Status: AC
  Filled 2015-11-04: qty 2

## 2015-11-04 MED ORDER — KETOROLAC TROMETHAMINE 30 MG/ML IJ SOLN
30.0000 mg | Freq: Once | INTRAMUSCULAR | Status: AC | PRN
  Administered 2015-11-04: 30 mg via INTRAVENOUS

## 2015-11-04 MED ORDER — LIDOCAINE HCL (CARDIAC) 20 MG/ML IV SOLN
INTRAVENOUS | Status: DC | PRN
  Administered 2015-11-04: 60 mg via INTRAVENOUS

## 2015-11-04 MED ORDER — CEFAZOLIN SODIUM-DEXTROSE 2-4 GM/100ML-% IV SOLN
INTRAVENOUS | Status: AC
  Filled 2015-11-04: qty 100

## 2015-11-04 MED ORDER — FENTANYL CITRATE (PF) 100 MCG/2ML IJ SOLN
INTRAMUSCULAR | Status: DC | PRN
  Administered 2015-11-04: 50 ug via INTRAVENOUS
  Administered 2015-11-04 (×2): 100 ug via INTRAVENOUS

## 2015-11-04 MED ORDER — HYDROMORPHONE HCL 1 MG/ML IJ SOLN
INTRAMUSCULAR | Status: AC
  Filled 2015-11-04: qty 1

## 2015-11-04 MED ORDER — LACTATED RINGERS IV SOLN
INTRAVENOUS | Status: DC | PRN
  Administered 2015-11-04 (×2): via INTRAVENOUS

## 2015-11-04 MED ORDER — OXYCODONE-ACETAMINOPHEN 5-325 MG PO TABS
ORAL_TABLET | ORAL | Status: AC
  Filled 2015-11-04: qty 1

## 2015-11-04 MED ORDER — LIDOCAINE HCL 4 % EX SOLN
CUTANEOUS | Status: DC | PRN
  Administered 2015-11-04: 2 mL via TOPICAL

## 2015-11-04 MED ORDER — ACETAMINOPHEN 10 MG/ML IV SOLN
INTRAVENOUS | Status: DC | PRN
  Administered 2015-11-04: 1000 mg via INTRAVENOUS

## 2015-11-04 MED ORDER — MIDAZOLAM HCL 2 MG/2ML IJ SOLN
INTRAMUSCULAR | Status: AC
  Filled 2015-11-04: qty 2

## 2015-11-04 MED ORDER — METHOCARBAMOL 500 MG PO TABS: 500.0000 mg | ORAL_TABLET | Freq: Four times a day (QID) | ORAL | 0 refills | Status: AC

## 2015-11-04 MED ORDER — CHLORHEXIDINE GLUCONATE 4 % EX LIQD: 60.0000 mL | Freq: Once | CUTANEOUS | Status: DC

## 2015-11-04 MED ORDER — ROCURONIUM BROMIDE 50 MG/5ML IV SOLN
INTRAVENOUS | Status: AC
  Filled 2015-11-04: qty 1

## 2015-11-04 MED ORDER — CEFAZOLIN SODIUM-DEXTROSE 2-4 GM/100ML-% IV SOLN
2.0000 g | INTRAVENOUS | Status: AC
  Administered 2015-11-04: 2 g via INTRAVENOUS

## 2015-11-04 MED ORDER — LACTATED RINGERS IV SOLN
INTRAVENOUS | Status: DC
  Administered 2015-11-04: 15:00:00 via INTRAVENOUS

## 2015-11-04 MED ORDER — ONDANSETRON HCL 4 MG/2ML IJ SOLN
INTRAMUSCULAR | Status: DC | PRN
  Administered 2015-11-04: 4 mg via INTRAVENOUS

## 2015-11-04 MED ORDER — FENTANYL CITRATE (PF) 250 MCG/5ML IJ SOLN
INTRAMUSCULAR | Status: AC
  Filled 2015-11-04: qty 5

## 2015-11-04 MED ORDER — BUPIVACAINE HCL (PF) 0.25 % IJ SOLN
INTRAMUSCULAR | Status: DC | PRN
  Administered 2015-11-04: 20 mL

## 2015-11-04 MED ORDER — MIDAZOLAM HCL 5 MG/5ML IJ SOLN
INTRAMUSCULAR | Status: DC | PRN
  Administered 2015-11-04: 2 mg via INTRAVENOUS

## 2015-11-04 MED ORDER — PROPOFOL 10 MG/ML IV BOLUS
INTRAVENOUS | Status: DC | PRN
  Administered 2015-11-04: 200 mg via INTRAVENOUS

## 2015-11-04 MED ORDER — MORPHINE SULFATE (PF) 4 MG/ML IV SOLN
INTRAVENOUS | Status: DC | PRN
  Administered 2015-11-04: 8 mg via INTRAMUSCULAR

## 2015-11-04 MED ORDER — METHOCARBAMOL 500 MG PO TABS
500.0000 mg | ORAL_TABLET | Freq: Once | ORAL | Status: AC
  Administered 2015-11-04: 500 mg via ORAL

## 2015-11-04 MED ORDER — OXYCODONE-ACETAMINOPHEN 5-325 MG PO TABS: 1.0000 | ORAL_TABLET | Freq: Four times a day (QID) | ORAL | 0 refills | Status: AC | PRN

## 2015-11-04 MED ORDER — OXYCODONE-ACETAMINOPHEN 5-325 MG PO TABS
1.0000 | ORAL_TABLET | Freq: Once | ORAL | Status: AC
  Administered 2015-11-04: 1 via ORAL

## 2015-11-04 MED ORDER — LIDOCAINE 2% (20 MG/ML) 5 ML SYRINGE
INTRAMUSCULAR | Status: AC
  Filled 2015-11-04: qty 5

## 2015-11-04 MED ORDER — BUPIVACAINE HCL (PF) 0.25 % IJ SOLN
INTRAMUSCULAR | Status: AC
  Filled 2015-11-04: qty 30

## 2015-11-04 MED ORDER — METHOCARBAMOL 500 MG PO TABS
ORAL_TABLET | ORAL | Status: AC
  Filled 2015-11-04: qty 1

## 2015-11-04 MED ORDER — KETOROLAC TROMETHAMINE 30 MG/ML IJ SOLN
INTRAMUSCULAR | Status: AC
  Filled 2015-11-04: qty 1

## 2015-11-04 MED ORDER — PROMETHAZINE HCL 25 MG/ML IJ SOLN: 6.2500 mg | INTRAMUSCULAR | Status: DC | PRN

## 2015-11-04 MED ORDER — SUCCINYLCHOLINE CHLORIDE 20 MG/ML IJ SOLN
INTRAMUSCULAR | Status: DC | PRN
  Administered 2015-11-04: 120 mg via INTRAVENOUS

## 2015-11-04 MED ORDER — FENTANYL CITRATE (PF) 100 MCG/2ML IJ SOLN
INTRAMUSCULAR | Status: AC
  Filled 2015-11-04: qty 2

## 2015-11-04 MED ORDER — CLONIDINE HCL (ANALGESIA) 100 MCG/ML EP SOLN
EPIDURAL | Status: DC | PRN
  Administered 2015-11-04: 100 ug via INTRA_ARTICULAR

## 2015-11-04 MED ORDER — HYDROMORPHONE HCL 1 MG/ML IJ SOLN
0.2500 mg | INTRAMUSCULAR | Status: DC | PRN
  Administered 2015-11-04 (×2): 0.5 mg via INTRAVENOUS

## 2015-11-04 MED ORDER — DEXAMETHASONE SODIUM PHOSPHATE 10 MG/ML IJ SOLN
INTRAMUSCULAR | Status: DC | PRN
  Administered 2015-11-04: 5 mg via INTRAVENOUS

## 2015-11-04 MED ORDER — 0.9 % SODIUM CHLORIDE (POUR BTL) OPTIME
TOPICAL | Status: DC | PRN
  Administered 2015-11-04: 1000 mL

## 2015-11-04 MED ORDER — PROPOFOL 10 MG/ML IV BOLUS
INTRAVENOUS | Status: AC
  Filled 2015-11-04: qty 40

## 2015-11-04 MED ORDER — ACETAMINOPHEN 10 MG/ML IV SOLN
INTRAVENOUS | Status: AC
  Filled 2015-11-04: qty 100

## 2015-11-04 SURGICAL SUPPLY — 73 items
BENZOIN TINCTURE PRP APPL 2/3 (GAUZE/BANDAGES/DRESSINGS) ×3 IMPLANT
BIT DRILL 2.5 CANN ENDOSCOPIC (BIT) ×3 IMPLANT
CLOSURE WOUND 1/2 X4 (GAUZE/BANDAGES/DRESSINGS) ×2
COUNTERSINK 2.0X25MM (INSTRUMENTS) ×3
COVER SURGICAL LIGHT HANDLE (MISCELLANEOUS) ×3 IMPLANT
DECANTER SPIKE VIAL GLASS SM (MISCELLANEOUS) ×3 IMPLANT
DRAIN PENROSE 1/2X12 LTX STRL (WOUND CARE) IMPLANT
DRAPE C-ARM 42X72 X-RAY (DRAPES) IMPLANT
DRAPE IMP U-DRAPE 54X76 (DRAPES) ×3 IMPLANT
DRAPE INCISE IOBAN 66X45 STRL (DRAPES) ×6 IMPLANT
DRAPE U-SHAPE 47X51 STRL (DRAPES) ×6 IMPLANT
DRILL BIT 2.5MM MINI-QUICK CON (BIT) ×3 IMPLANT
DRSG AQUACEL AG ADV 3.5X10 (GAUZE/BANDAGES/DRESSINGS) ×3 IMPLANT
DRSG MEPILEX BORDER 4X12 (GAUZE/BANDAGES/DRESSINGS) IMPLANT
DRSG MEPILEX BORDER 4X8 (GAUZE/BANDAGES/DRESSINGS) ×3 IMPLANT
DRSG PAD ABDOMINAL 8X10 ST (GAUZE/BANDAGES/DRESSINGS) IMPLANT
DURAPREP 26ML APPLICATOR (WOUND CARE) ×3 IMPLANT
ELECT REM PT RETURN 9FT ADLT (ELECTROSURGICAL) ×3
ELECTRODE REM PT RTRN 9FT ADLT (ELECTROSURGICAL) ×1 IMPLANT
FACESHIELD WRAPAROUND (MASK) ×3 IMPLANT
GAUZE SPONGE 4X4 12PLY STRL (GAUZE/BANDAGES/DRESSINGS) IMPLANT
GAUZE XEROFORM 5X9 LF (GAUZE/BANDAGES/DRESSINGS) IMPLANT
GLOVE BIO SURGEON ST LM GN SZ9 (GLOVE) ×3 IMPLANT
GLOVE BIOGEL PI IND STRL 8 (GLOVE) ×1 IMPLANT
GLOVE BIOGEL PI INDICATOR 8 (GLOVE) ×2
GLOVE SURG ORTHO 8.0 STRL STRW (GLOVE) ×3 IMPLANT
GOWN STRL REUS W/ TWL LRG LVL3 (GOWN DISPOSABLE) ×2 IMPLANT
GOWN STRL REUS W/ TWL XL LVL3 (GOWN DISPOSABLE) ×1 IMPLANT
GOWN STRL REUS W/TWL LRG LVL3 (GOWN DISPOSABLE) ×4
GOWN STRL REUS W/TWL XL LVL3 (GOWN DISPOSABLE) ×2
K-WIRE BB-TAK (WIRE) ×3
KIT BASIN OR (CUSTOM PROCEDURE TRAY) ×3 IMPLANT
KIT ROOM TURNOVER OR (KITS) ×3 IMPLANT
KWIRE BB-TAK (WIRE) ×1 IMPLANT
MANIFOLD NEPTUNE II (INSTRUMENTS) ×3 IMPLANT
NEEDLE 18GX1X1/2 (RX/OR ONLY) (NEEDLE) ×3 IMPLANT
NEEDLE 21X1 OR PACK (NEEDLE) IMPLANT
NS IRRIG 1000ML POUR BTL (IV SOLUTION) ×3 IMPLANT
PACK SHOULDER (CUSTOM PROCEDURE TRAY) ×3 IMPLANT
PACK UNIVERSAL I (CUSTOM PROCEDURE TRAY) ×3 IMPLANT
PAD ARMBOARD 7.5X6 YLW CONV (MISCELLANEOUS) ×6 IMPLANT
PENCIL BUTTON HOLSTER BLD 10FT (ELECTRODE) IMPLANT
PLATE CLAV FX CENTRAL TRD R SS (Plate) ×3 IMPLANT
SCREW COUNTERSINK 2.0X25MM (INSTRUMENTS) ×1 IMPLANT
SCREW LOCK T15 FT 18X3.5XST (Screw) ×1 IMPLANT
SCREW LOCKING 3.5X18 (Screw) ×2 IMPLANT
SCREW NLOCK T15 FT 18X3.5XST (Screw) ×4 IMPLANT
SCREW NON LOCK 3.5X18MM (Screw) ×8 IMPLANT
SCREW NON-LOCKING 3.5X20 ANKLE (Screw) ×9 IMPLANT
SCREW PEG 15MM (Screw) ×3 IMPLANT
SCREW PEG 2.5X18 NONLOCK (Screw) ×6 IMPLANT
SLEEVE SURGEON STRL (DRAPES) ×3 IMPLANT
SLING ARM IMMOBILIZER LRG (SOFTGOODS) ×3 IMPLANT
SLING ARM IMMOBILIZER MED (SOFTGOODS) IMPLANT
SPONGE LAP 4X18 X RAY DECT (DISPOSABLE) ×6 IMPLANT
STAPLER VISISTAT 35W (STAPLE) IMPLANT
STRIP CLOSURE SKIN 1/2X4 (GAUZE/BANDAGES/DRESSINGS) ×4 IMPLANT
SUCTION FRAZIER HANDLE 10FR (MISCELLANEOUS)
SUCTION TUBE FRAZIER 10FR DISP (MISCELLANEOUS) IMPLANT
SUT ETHILON 3 0 FSL (SUTURE) ×3 IMPLANT
SUT MNCRL AB 3-0 PS2 18 (SUTURE) ×3 IMPLANT
SUT VIC AB 0 CT1 27 (SUTURE) ×2
SUT VIC AB 0 CT1 27XBRD ANBCTR (SUTURE) ×1 IMPLANT
SUT VIC AB 1 CT1 27 (SUTURE) ×4
SUT VIC AB 1 CT1 27XBRD ANBCTR (SUTURE) ×2 IMPLANT
SUT VIC AB 2-0 CTB1 (SUTURE) ×3 IMPLANT
SYR 3ML LL SCALE MARK (SYRINGE) ×3 IMPLANT
TOWEL OR 17X24 6PK STRL BLUE (TOWEL DISPOSABLE) ×3 IMPLANT
TOWEL OR 17X26 10 PK STRL BLUE (TOWEL DISPOSABLE) ×3 IMPLANT
TUBE CONNECTING 12'X1/4 (SUCTIONS)
TUBE CONNECTING 12X1/4 (SUCTIONS) IMPLANT
WATER STERILE IRR 1000ML POUR (IV SOLUTION) IMPLANT
YANKAUER SUCT BULB TIP NO VENT (SUCTIONS) IMPLANT
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
## 2015-11-04 NOTE — Anesthesia Preprocedure Evaluation (Signed)

## 2015-11-04 NOTE — Transfer of Care (Signed)
Immediate Anesthesia Transfer of Care Note  Patient: Dennis Eaton  Procedure(s) Performed: Procedure(s): OPEN REDUCTION INTERNAL FIXATION (ORIF) CLAVICULAR FRACTURE (Right)  Patient Location: PACU  Anesthesia Type:General  Level of Consciousness: awake, alert , oriented and patient cooperative  Airway & Oxygen Therapy: Patient Spontanous Breathing and Patient connected to nasal cannula oxygen  Post-op Assessment: Report given to RN and Post -op Vital signs reviewed and stable  Post vital signs: Reviewed and stable  Last Vitals:  Vitals:   11/04/15 1458 11/04/15 2021  BP: (!) 152/72   Pulse: (!) 57 86  Resp: 18 12  Temp: 37 C 36.3 C    Last Pain:  Vitals:   11/04/15 2021  TempSrc:   PainSc: (P) 0-No pain         Complications: No apparent anesthesia complications

## 2015-11-04 NOTE — Anesthesia Postprocedure Evaluation (Signed)
Anesthesia Post Note  Patient: Dennis Eaton  Procedure(s) Performed: Procedure(s) (LRB): OPEN REDUCTION INTERNAL FIXATION (ORIF) CLAVICULAR FRACTURE (Right)  Patient location during evaluation: PACU Anesthesia Type: General Level of consciousness: awake and alert Pain management: pain level controlled Vital Signs Assessment: post-procedure vital signs reviewed and stable Respiratory status: spontaneous breathing, nonlabored ventilation, respiratory function stable and patient connected to nasal cannula oxygen Cardiovascular status: blood pressure returned to baseline and stable Postop Assessment: no signs of nausea or vomiting Anesthetic complications: no    Last Vitals:  Vitals:   11/04/15 2044 11/04/15 2051  BP: (!) 153/71 (!) 151/71  Pulse: 77 77  Resp: 19 17  Temp:  36.7 C    Last Pain:  Vitals:   11/04/15 2051  TempSrc:   PainSc: 5                  Kennieth Rad

## 2015-11-04 NOTE — Anesthesia Procedure Notes (Signed)
Procedure Name: Intubation Date/Time: 11/04/2015 5:23 PM Performed by: Izora Gala Pre-anesthesia Checklist: Patient identified, Emergency Drugs available, Suction available and Patient being monitored Patient Re-evaluated:Patient Re-evaluated prior to inductionOxygen Delivery Method: Circle system utilized Preoxygenation: Pre-oxygenation with 100% oxygen Intubation Type: IV induction Ventilation: Mask ventilation without difficulty Laryngoscope Size: Miller and 3 Grade View: Grade I Tube type: Oral Tube size: 7.5 mm Number of attempts: 1 Airway Equipment and Method: Stylet and LTA kit utilized Placement Confirmation: ETT inserted through vocal cords under direct vision,  positive ETCO2 and breath sounds checked- equal and bilateral Secured at: 22 cm Tube secured with: Tape Dental Injury: Teeth and Oropharynx as per pre-operative assessment

## 2015-11-04 NOTE — Brief Op Note (Signed)
11/04/2015  8:17 PM  PATIENT:  Dennis Eaton  45 y.o. male  PRE-OPERATIVE DIAGNOSIS:  RIGHT CLAVICLE FRACTURE  POST-OPERATIVE DIAGNOSIS:  RIGHT CLAVICLE FRACTURE  PROCEDURE:  Procedure(s): OPEN REDUCTION INTERNAL FIXATION (ORIF) CLAVICULAR FRACTURE  SURGEON:  Surgeon(s): Cammy Copa, MD  ASSISTANT: April Green rnfa  ANESTHESIA:   general  EBL: 50 ml    Total I/O In: 400 [I.V.:400] Out: 30 [Blood:30]  BLOOD ADMINISTERED: none  DRAINS: none   LOCAL MEDICATIONS USED:  Marcaine mso4 clonidine  SPECIMEN:  No Specimen  COUNTS:  YES  TOURNIQUET:  * No tourniquets in log *  DICTATION: .Other Dictation: Dictation Number [id number]  PLAN OF CARE: Discharge to home after PACU  PATIENT DISPOSITION:  PACU - hemodynamically stable
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
## 2015-11-04 NOTE — Progress Notes (Signed)
1 belongings bag taken over to PACU and placed in cupboard, and signed into the "book".

## 2015-11-06 ENCOUNTER — Encounter (HOSPITAL_COMMUNITY): Payer: Self-pay | Admitting: Orthopedic Surgery

## 2016-01-07 ENCOUNTER — Ambulatory Visit (INDEPENDENT_AMBULATORY_CARE_PROVIDER_SITE_OTHER): Payer: BLUE CROSS/BLUE SHIELD | Admitting: Orthopedic Surgery

## 2016-01-07 DIAGNOSIS — S42024D Nondisplaced fracture of shaft of right clavicle, subsequent encounter for fracture with routine healing: Secondary | ICD-10-CM
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
## 2016-04-12 ENCOUNTER — Ambulatory Visit: Payer: BLUE CROSS/BLUE SHIELD | Admitting: Podiatry

## 2017-10-08 IMAGING — RF DG CLAVICLE*R*
1 series · 1 of 1 positions shown · non-contrast
Comparison: 10/29/2015

FLUOROSCOPY TIME:  0 minutes 20 seconds

Images obtained: 1

CLINICAL DATA: RIGHT clavicular fracture, ORIF

EXAM:
DG C-ARM 61-120 MIN; RIGHT CLAVICLE - 2+ VIEWS

[Series 1: run · 1 of 1 slices shown]
[im 1/1]
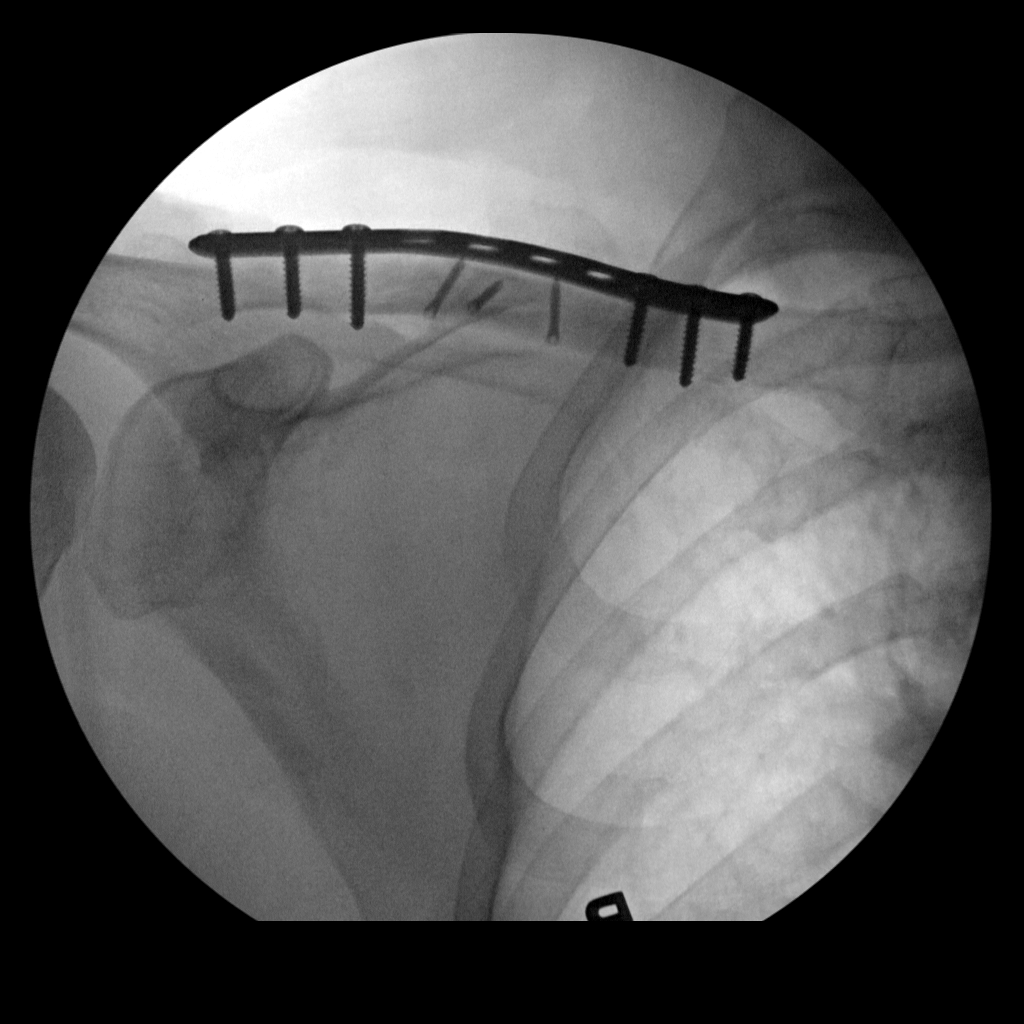

[1 of 1 positions shown; findings below may reference images not displayed]

FINDINGS: Dorsal plate and multiple screws identified across a reduced
fracture of the middle third RIGHT clavicle.

No other focal bony abnormality seen.
IMPRESSION: Post ORIF of a reduced fracture at the middle third of the RIGHT
clavicle.

## 2018-03-06 ENCOUNTER — Inpatient Hospital Stay
Admit: 2018-03-06 | Payer: PRIVATE HEALTH INSURANCE | Primary: Student in an Organized Health Care Education/Training Program

## 2018-03-06 DIAGNOSIS — M549 Dorsalgia, unspecified: Secondary | ICD-10-CM

## 2018-03-06 NOTE — Progress Notes (Signed)
[facility]   [address] [place], [place] [address]  Phone: [phone] Fax: [phone]  PLAN OF CARE / STATEMENT OF MEDICAL NECESSITY FOR PHYSICAL THERAPY SERVICES  Patient Name: Cameron Price DOB: Nov 29, 1970   Medical   Diagnosis: Chronic upper back pain [M54.9, G89.29] Treatment Diagnosis: R Rhomboid strain    Onset Date: chronic     Referral Source: Ria Bush, MD Start of Care [facility]): 03/06/2018   Prior Hospitalization: See medical history Provider #: [phone]   Prior Level of Function: WNL, cyclist   Comorbidities: R clavicular fx and fixation   Medications: Verified on Patient Summary List   The Plan of Care and following information is based on the information from the initial evaluation.   ========================================================================  Assessment / key information:  Pt is a 47yo male with chronic R medial scapular pain. Notes a hx of pain from lots of cycling, prior to and then worsened (?) after clavicular fx.  Previous rx has included the following: PT for IMT (2018) with great relief of pain. Pt has also seen [facility] here recently with 2-3x IMT sessions focused on the UT which did not alleviate pain. Also notes helpful addition of Tband rows and extensions.     He presents with pain ranging from 1.5-8/10, located R periscapular region. Pain fluctuates. Pain is made worse with cycling, intermittent activites, better with dry needling.   Functional limitations include: C/S rotations, getting comfortable.    Denies HA, Dizziness, tinnitus, vision changes, syncope, red flags.      C/S AROM: flexion chin to chest, extension 25%, LSB 25%, RSB 25%, LRotation 60 deg with pain RRotation 60deg with pain.   T/S AROm: rotation 50%   Palpation: TTP and TrP with twitch responses in the R Rhomboid minor and major, mid trapezius, TTP in the LS but no TrP.   Decreased joint mobility throughout the T/S and C/S   Posture FHRS.    GHJ AROM is WNL all planes with signif winging L > R and anterior tilting of the (B) scapula.   GHJ MMT: all 5/5 except R GHJ ER 4+/5. Lower trap R 3+/5, L 4+/5, mid trap 4+/5 (B).   Pt will benefit from PT interventions to address the aforementioned deficits and allow pt to return to PLOF.    ========================================================================  Eval Complexity: History: LOW Complexity : Zero comorbidities / personal factors that will impact the outcome / POCExam:HIGH Complexity : 4+ Standardized tests and measures addressing body structure, function, activity limitation and / or participation in recreation  Presentation: LOW Complexity : Stable, uncomplicated  Clinical Decision Making:MEDIUM Complexity : FOTO score of 26-74Overall Complexity:LOW   Problem List: pain affecting function, decrease ROM, decrease strength, decrease ADL/ functional abilitiies, decrease activity tolerance, decrease flexibility/ joint mobility and decrease transfer abilities   Treatment Plan may include any combination of the following: Therapeutic exercise, Therapeutic activities, Neuromuscular re-education, Physical agent/modality, Manual therapy, Patient education, Self Care training, Functional mobility training, Home safety training and Stair training; Dry needling per script   Patient / Family readiness to learn indicated by: asking questions, trying to perform skills and interest  Persons(s) to be included in education: patient (P)  Barriers to Learning/Limitations: None  Measures taken:    Patient Goal (s): "relief of mm tightness"   Patient self reported health status: good  Rehabilitation Potential: excellent  . Short Term Goals: To be accomplished in  3  treatments:  1. Pt will be independent and compliant with HEP to decrease pain, increase ROM and return pt to PLOF.    2. Pt will note a decrease in TrP in the R rhomboid by > or = 50% to improve ADLs and cycling.   Marland Kitchen 3.Long Term Goals: To be accomplished  in  4-8  treatments:  1. Pt will increase score on the FOTO to > or = 77/100 to demo an increase in functional activity tolerance.  2. Pt will have WNL, pain-free AROM of the cervical spine in all planes to increase safety with driving, ADL's and self-care.    3. Pt will note < or = 2/10 pain with all mobility to improve comfort with ADL's.   4. Pt will be able to bike for > or = 1 hour with no increase in pain > or = 2/10 as managed with TE and postural changes to ipmrove function.   Frequency / Duration:   Patient to be seen  1  times per week for 4-8  treatments:  Patient / Caregiver education and instruction: self care, activity modification and exercises  Therapist Signature: Ma Rings, DPT Date: 03/06/2018   Certification Period: Na  Time: 8:05 AM   ========================================================================  I certify that the above Physical Therapy Services are being furnished while the patient is under my care.  I agree with the treatment plan and certify that this therapy is necessary.  Physician Signature:        Date:       Time:   Please sign and return to [facility] or you may fax the signed copy to [phone].  Thank you.

## 2018-03-06 NOTE — Progress Notes (Signed)
 PT DAILY TREATMENT NOTE 8-14    Patient Name: Cameron Price  Date:03/06/2018  DOB: [birth year]  [x]   Patient DOB Verified  Payor: [employer]ULAN / Plan: [employer] PPO / Product Type: PPO /    In time:9:34  Out time:10:34  Total Treatment Time (min): 60  Total Timed Codes (min): 40  1:1 Treatment Time (min): 60   Visit #: 1 of 4-8    Treatment Area: Chronic upper back pain [M54.9, G89.29]    SUBJECTIVE  Pain Level (0-10 scale): 5  Any medication changes, allergies to medications, adverse drug reactions, diagnosis change, or new procedure performed?: [x]  No    []  Yes (see summary sheet for update)  Subjective functional status/changes:   []  No changes reported  See IE     OBJECTIVE    25 min Therapeutic Exercise:  [x]  See flow sheet : prone rhomboid activation, prone T and  Y's, scapular wall slides    Rationale: increase ROM, increase strength and improve coordination to improve the patient's ability to bike, C/S mobility        18 min Manual Therapy:    IMT followed by DTM to mm needled, PA glides to T/S in prone  Technique:      [x]  S/DTM [x] IASTM [] PROM []  Passive Stretching   [] manual TPR  [x]  TDN (see objective; actual needle insertion time not billed)  [x] Jt manipulation:Gr I []  II []   III [x]  IV[]  V[]   Treatment/Area:     Rationale:      decrease pain, increase ROM, increase tissue extensibility and decrease trigger points to improve patient's ability to bike, C/S mobility     Dry Needling Procedure Note    Dry Needle Session Number:  1    Procedure:   An intramuscular manual therapy (dry needling) and a neuro-muscular re-education treatment was done to deactivate myofascial trigger points, with a 15/30 gauge solid filament needle, under aseptic technique.    Indication(s): [x]  Muscle spasms []  Myalgia/Myositis  [x]  Muscle cramps      [x]  Muscle imbalances []  TMD (TMJ) []  Myofascial pain & dysfunction     []  Other: __    Chart reviewed for the following:  I, [name], PT, have reviewed the medical history, summary  list and precautions/contraindications for Cameron Price.    TIME OUT performed immediately prior to start of procedure:  9:55 (enter time the timeout was completed)  I, [name], PT, have performed the following reviews on Cameron Price prior to the start of the session:      [x]  Patient was identified by name and date of birth    [x]  Agreement on all muscles being treated was verified   [x]  Purpose of dry needling, side effects, possible complications, risks and benefits were explained to the patient   [x]  Procedure site(s) verified  [x]  Patient was positioned for comfort and draped for privacy  [x]  Informed Consent was signed (initial visit) and verified verbally (subsequent visits)  [x]  Patient was instructed on the need to report the use of blood thinners and/or immunosuppressant medications  [x]  How to respond to possible adverse effects of treatment  [x]  Self treatment of post needling soreness: ice, heat (moist heat, heat wraps) and stretching  [x]  Opportunity was given to ask any questions, all questions were answered            Treatment:  The following muscles were treated today:    Right: Rhomboid, LS, mid trap   Left:  Patient's response to today's treatment:   [x]   LTR's  [x]   Muscle Relaxation  [x]   Pain Relief  []   Decreased Tinnitus  []   Decreased HA's [x]   Post needling soreness []   Increased ROM   []   Other:        x min Pt Education     Initiate HEP  Post-needling instructions     Other Objective/Functional Measures:     Pain Level (0-10 scale) post treatment: 1    ASSESSMENT/Changes in Function: great response to IMT of the mid trap and rhomboid with PT able to identify and elicit the twitches in the TrP/mm that was causing his pain.   Significant LTR's today.     Patient will continue to benefit from skilled PT services to modify and progress therapeutic interventions, address functional mobility deficits, address ROM deficits, address strength deficits, analyze and address soft tissue  restrictions, analyze and cue movement patterns, analyze and modify body mechanics/ergonomics, assess and modify postural abnormalities, address imbalance/dizziness and instruct in home and community integration to attain remaining goals.     []   See Plan of Care  []   See progress note/recertification  []   See Discharge Summary         Progress towards goals / Updated goals:  See IE for goals     PLAN  [x]   Upgrade activities as tolerated     [x]   Continue plan of care  []   Update interventions per flow sheet       []   Discharge due to:_  [x]   Other:_  con't to address scapular stability and rhomboid TrP. Scheduled 1x/week for 3-4 sessions currently.     Powell Shin, DPT 03/06/2018  9:54 AM

## 2018-03-06 NOTE — Progress Notes (Signed)
PT DAILY TREATMENT NOTE 8-14    Patient Name: Cameron Price  Date:03/06/2018  DOB: 04-30-1970  [x]   Patient DOB Verified  Payor: Monia PouchAETNA / Plan: [employer] PPO / Product Type: PPO /    In time:9:34  Out time:10:34  Total Treatment Time (min): 60  Total Timed Codes (min): 40  1:1 Treatment Time (min): 60   Visit #: 1 of 4-8    Treatment Area: Chronic upper back pain [M54.9, G89.29]    SUBJECTIVE  Pain Level (0-10 scale): 5  Any medication changes, allergies to medications, adverse drug reactions, diagnosis change, or new procedure performed?: [x]  No    []  Yes (see summary sheet for update)  Subjective functional status/changes:   []  No changes reported  See IE     OBJECTIVE    25 min Therapeutic Exercise:  [x]  See flow sheet : prone rhomboid activation, prone T and  Y's, scapular wall slides    Rationale: increase ROM, increase strength and improve coordination to improve the patient???s ability to bike, C/S mobility        18 min Manual Therapy:    IMT followed by DTM to mm needled, PA glides to T/S in prone  Technique:      [x]  S/DTM [x] IASTM [] PROM []  Passive Stretching   [] manual TPR  [x]  TDN (see objective; actual needle insertion time not billed)  [x] Jt manipulation:Gr I []  II []   III [x]  IV[]  V[]   Treatment/Area:     Rationale:      decrease pain, increase ROM, increase tissue extensibility and decrease trigger points to improve patient's ability to bike, C/S mobility     Dry Needling Procedure Note    Dry Needle Session Number:  1    Procedure:   An intramuscular manual therapy (dry needling) and a neuro-muscular re-education treatment was done to deactivate myofascial trigger points, with a 15/30 gauge solid filament needle, under aseptic technique.    Indication(s): [x]  Muscle spasms []  Myalgia/Myositis  [x]  Muscle cramps      [x]  Muscle imbalances []  TMD (TMJ) []  Myofascial pain & dysfunction     []  Other: __    Chart reviewed for the following:   I, Darlyn ReadHeather M Mckinnley Cottier, PT, have reviewed the medical history, summary list and precautions/contraindications for Cameron Price.    TIME OUT performed immediately prior to start of procedure:  9:55 (enter time the timeout was completed)  I, Darlyn ReadHeather M Ezekial Arns, PT, have performed the following reviews on Cameron Price prior to the start of the session:      [x]  Patient was identified by name and date of birth    [x]  Agreement on all muscles being treated was verified   [x]  Purpose of dry needling, side effects, possible complications, risks and benefits were explained to the patient   [x]  Procedure site(s) verified  [x]  Patient was positioned for comfort and draped for privacy  [x]  Informed Consent was signed (initial visit) and verified verbally (subsequent visits)  [x]  Patient was instructed on the need to report the use of blood thinners and/or immunosuppressant medications  [x]  How to respond to possible adverse effects of treatment  [x]  Self treatment of post needling soreness: ice, heat (moist heat, heat wraps) and stretching  [x]  Opportunity was given to ask any questions, all questions were answered            Treatment:  The following muscles were treated today:    Right: Rhomboid, LS, mid trap   Left:  Patient???s response to today???s treatment:   [x]   LTR???s  [x]   Muscle Relaxation  [x]   Pain Relief  []   Decreased Tinnitus  []   Decreased HA???s [x]   Post needling soreness []   Increased ROM   []   Other:        x min Pt Education     Initiate HEP  Post-needling instructions     Other Objective/Functional Measures:     Pain Level (0-10 scale) post treatment: 1    ASSESSMENT/Changes in Function: great response to IMT of the mid trap and rhomboid with PT able to identify and elicit the twitches in the TrP/mm that was causing his pain.   Significant LTR's today.     Patient will continue to benefit from skilled PT services to modify and progress therapeutic interventions, address functional mobility deficits,  address ROM deficits, address strength deficits, analyze and address soft tissue restrictions, analyze and cue movement patterns, analyze and modify body mechanics/ergonomics, assess and modify postural abnormalities, address imbalance/dizziness and instruct in home and community integration to attain remaining goals.     []   See Plan of Care  []   See progress note/recertification  []   See Discharge Summary         Progress towards goals / Updated goals:  See IE for goals     PLAN  [x]   Upgrade activities as tolerated     [x]   Continue plan of care  []   Update interventions per flow sheet       []   Discharge due to:_  [x]   Other:_  con't to address scapular stability and rhomboid TrP. Scheduled 1x/week for 3-4 sessions currently.     Ma Rings, DPT 03/06/2018  9:54 AM

## 2018-03-06 NOTE — Progress Notes (Signed)
[facility]   [address] [place], [place] [id number]  Phone: [phone] Fax: [phone]  PLAN OF CARE / STATEMENT OF MEDICAL NECESSITY FOR PHYSICAL THERAPY SERVICES  Patient Name: Carmelina Peallliot Fread DOB: 01-30-1971   Medical   Diagnosis: Chronic upper back pain [M54.9, G89.29] Treatment Diagnosis: R Rhomboid strain    Onset Date: chronic     Referral Source: Ria BushFedro, Randall C, MD Start of Care [facility]): 03/06/2018   Prior Hospitalization: See medical history Provider #: [id number] [id number]   Prior Level of Function: WNL, cyclist   Comorbidities: R clavicular fx and fixation   Medications: Verified on Patient Summary List   The Plan of Care and following information is based on the information from the initial evaluation.   ========================================================================  Assessment / key information:  Pt is a 47yo male with chronic R medial scapular pain. Notes a hx of pain from lots of cycling, prior to and then worsened (?) after clavicular fx.  Previous rx has included the following: PT for IMT (2018) with great relief of pain. Pt has also seen [facility] here recently with 2-3x IMT sessions focused on the UT which did not alleviate pain. Also notes helpful addition of Tband rows and extensions.     He presents with pain ranging from 1.5-8/10, located R periscapular region. Pain fluctuates. Pain is made worse with cycling, intermittent activites, better with dry needling.   Functional limitations include: C/S rotations, getting comfortable.    Denies HA, Dizziness, tinnitus, vision changes, syncope, red flags.      C/S AROM: flexion chin to chest, extension 25%, LSB 25%, RSB 25%, LRotation 60 deg with pain RRotation 60deg with pain.   T/S AROm: rotation 50%   Palpation: TTP and TrP with twitch responses in the R Rhomboid minor and major, mid trapezius, TTP in the LS but no TrP.   Decreased joint mobility throughout the T/S and C/S   Posture FHRS.    GHJ AROM is WNL all planes with signif winging L > R and anterior tilting of the (B) scapula.   GHJ MMT: all 5/5 except R GHJ ER 4+/5. Lower trap R 3+/5, L 4+/5, mid trap 4+/5 (B).   Pt will benefit from PT interventions to address the aforementioned deficits and allow pt to return to PLOF.    ========================================================================  Eval Complexity: History: LOW Complexity : Zero comorbidities / personal factors that will impact the outcome / POCExam:HIGH Complexity : 4+ Standardized tests and measures addressing body structure, function, activity limitation and / or participation in recreation  Presentation: LOW Complexity : Stable, uncomplicated  Clinical Decision Making:MEDIUM Complexity : FOTO score of 26-74Overall Complexity:LOW   Problem List: pain affecting function, decrease ROM, decrease strength, decrease ADL/ functional abilitiies, decrease activity tolerance, decrease flexibility/ joint mobility and decrease transfer abilities   Treatment Plan may include any combination of the following: Therapeutic exercise, Therapeutic activities, Neuromuscular re-education, Physical agent/modality, Manual therapy, Patient education, Self Care training, Functional mobility training, Home safety training and Stair training; Dry needling per script   Patient / Family readiness to learn indicated by: asking questions, trying to perform skills and interest  Persons(s) to be included in education: patient (P)  Barriers to Learning/Limitations: None  Measures taken:    Patient Goal (s): "relief of mm tightness"   Patient self reported health status: good  Rehabilitation Potential: excellent  ? Short Term Goals: To be accomplished in  3  treatments:  1. Pt will be independent and compliant with HEP to decrease pain, increase ROM and return pt to PLOF.    2. Pt will note a decrease in TrP in the R rhomboid by > or = 50% to improve ADLs and cycling.    ? 3.Long Term Goals: To be accomplished in  4-8  treatments:  1. Pt will increase score on the FOTO to > or = 77/100 to demo an increase in functional activity tolerance.  2. Pt will have WNL, pain-free AROM of the cervical spine in all planes to increase safety with driving, ADL???s and self-care.    3. Pt will note < or = 2/10 pain with all mobility to improve comfort with ADL???s.   4. Pt will be able to bike for > or = 1 hour with no increase in pain > or = 2/10 as managed with TE and postural changes to ipmrove function.   Frequency / Duration:   Patient to be seen  1  times per week for 4-8  treatments:  Patient / Caregiver education and instruction: self care, activity modification and exercises  Therapist Signature: Ma Rings, DPT Date: 03/06/2018   Certification Period: Na  Time: 8:05 AM   ========================================================================  I certify that the above Physical Therapy Services are being furnished while the patient is under my care.  I agree with the treatment plan and certify that this therapy is necessary.  Physician Signature:        Date:       Time:   Please sign and return to [facility] or you may fax the signed copy to [phone].  Thank you.

## 2018-03-20 ENCOUNTER — Inpatient Hospital Stay
Admit: 2018-03-20 | Payer: PRIVATE HEALTH INSURANCE | Primary: Student in an Organized Health Care Education/Training Program

## 2018-03-20 NOTE — Progress Notes (Signed)
 PT DAILY TREATMENT NOTE 8-14    Patient Name: Cameron Price  Date:03/20/2018  DOB: [birth year]  [x]   Patient DOB Verified  Payor: HULAN / Plan: [employer]ET[employer] PPO / Product Type: PPO /    In time:11:04  Out time:12:10  Total Treatment Time (min): 66  Total Timed Codes (min): 66  1:1 Treatment Time (min): 66   Visit #: 2 of 4-8     Treatment Area: Chronic upper back pain [M54.9, G89.29]    SUBJECTIVE  Pain Level (0-10 scale): 3  Any medication changes, allergies to medications, adverse drug reactions, diagnosis change, or new procedure performed?: [x]  No    []  Yes (see summary sheet for update)  Subjective functional status/changes:   []  No changes reported  Felt good for a week after needling and then things flared up again.   The push-up plus seems to irritate things up in the upper trap.     OBJECTIVE      41 min Therapeutic Exercise:  [x]  See flow sheet : mcKenzie progression for C/S and T/S    Rationale: increase ROM, increase strength and improve coordination to improve the patient's ability to bike, sit       25 min Manual Therapy:    IMT followed by DTM to mm needled, PA glides to T/S in prone Technique:      [x]  S/DTM [] IASTM [x] PROM []  Passive Stretching   [x] manual TPR  [x]  TDN (see objective; actual needle insertion time not billed)  [x] Jt manipulation:Gr I []  II []   III []  IV[x]  V[]   Treatment/Area:     Rationale:      decrease pain, increase ROM, increase tissue extensibility and decrease trigger points to improve patient's ability to bike, sit, work     Dry Needling Procedure Note    Dry Needle Session Number:  2    Procedure:   An intramuscular manual therapy (dry needling) and a neuro-muscular re-education treatment was done to deactivate myofascial trigger points, with a 15/30 gauge solid filament needle, under aseptic technique.    Indication(s): [x]  Muscle spasms [x]  Myalgia/Myositis  [x]  Muscle cramps      [x]  Muscle imbalances []  TMD (TMJ) [x]  Myofascial pain & dysfunction     []  Other: __    Chart  reviewed for the following:  I, [name], PT, have reviewed the medical history, summary list and precautions/contraindications for Cameron Price.    TIME OUT performed immediately prior to start of procedure:  11:30 (enter time the timeout was completed)  I, [name], PT, have performed the following reviews on Cameron Price prior to the start of the session:      [x]  Patient was identified by name and date of birth    [x]  Agreement on all muscles being treated was verified   [x]  Purpose of dry needling, side effects, possible complications, risks and benefits were explained to the patient   [x]  Procedure site(s) verified  [x]  Patient was positioned for comfort and draped for privacy  [x]  Informed Consent was signed (initial visit) and verified verbally (subsequent visits)  [x]  Patient was instructed on the need to report the use of blood thinners and/or immunosuppressant medications  [x]  How to respond to possible adverse effects of treatment  [x]  Self treatment of post needling soreness: ice, heat (moist heat, heat wraps) and stretching  [x]  Opportunity was given to ask any questions, all questions were answered            Treatment:  The following muscles were treated today:    Right: Rhomboid, mid trap, Levator   Left:      Patient's response to today's treatment:   [x]   LTR's  [x]   Muscle Relaxation  [x]   Pain Relief  []   Decreased Tinnitus  []   Decreased HA's [x]   Post needling soreness [x]   Increased ROM   []   Other:        x min Patient Education: [x]  Review HEP    [x]  Progressed/Changed HEP based on:   C/S RREIS with rotation (pt OP);  T/S Repeated R rotations  Box walks A/P and med/lateral      []  positioning   []  body mechanics   []  transfers   []  heat/ice application        Other Objective/Functional Measures:   C/S retraction extension: NE, increased ROm vs eval  T/S extension : limited  T/S R rotation: NE  Repeated R rotation: NE  Repeated R rotation with PT OP: NE  Whips: increased LS pain        Pain Level (0-10 scale) post treatment: 0    ASSESSMENT/Changes in Function: Pt has a decrease in R palpable TrP in the Rhomoboid after MT today. Min hemangioma noted, resolved with STM and DTm/light edema massage. Pt with restrictions in T/S joint mobility throughout, especially in the upper T/S. No worse with repeated T?S movements, but will address with HEP to determine if there is a positive effect/ centralization of sxs with use of repeated movements.       Patient will continue to benefit from skilled PT services to modify and progress therapeutic interventions, address functional mobility deficits, address ROM deficits, address strength deficits, analyze and address soft tissue restrictions, analyze and cue movement patterns, analyze and modify body mechanics/ergonomics, assess and modify postural abnormalities and instruct in home and community integration to attain remaining goals.     []   See Plan of Care  []   See progress note/recertification  []   See Discharge Summary         Progress towards goals / Updated goals:   Short Term Goals: To be accomplished in  3  treatments:  1. Pt will be independent and compliant with HEP to decrease pain, increase ROM and return pt to PLOF.    2. Pt will note a decrease in TrP in the R rhomboid by > or = 50% to improve ADLs and cycling.  Decreased by 100% today per pt and therapist report/palpation (03/20/18)   3.Long Term Goals: To be accomplished in  4-8  treatments:  1. Pt will increase score on the FOTO to > or = 77/100 to demo an increase in functional activity tolerance.  2. Pt will have WNL, pain-free AROM of the cervical spine in all planes to increase safety with driving, ADL's and self-care.  Progressed to C/S RREIS with pt OP (03/20/18)   3. Pt will note < or = 2/10 pain with all mobility to improve comfort with ADL's.  Decreased pain to 3/10 at start of visit and 0/10 after (03/20/18)  4. Pt will be able to bike for > or = 1 hour with no increase in  pain > or = 2/10 as managed with TE and postural changes to improve function.     PLAN  [x]   Upgrade activities as tolerated     [x]   Continue plan of care  []   Update interventions per flow sheet       []   Discharge  due to:_  [x]   Other:_  Assess rhomboid TrP, response to McKenzie proression   Powell Shin, DPT 03/20/2018  8:14 AM

## 2018-03-20 NOTE — Progress Notes (Signed)
PT DAILY TREATMENT NOTE 8-14    Patient Name: Cameron Price  Date:03/20/2018  DOB: Mar 13, 1971  [x]   Patient DOB Verified  Payor: Monia PouchAETNA / Plan: [employer] AET[employer] PPO / Product Type: PPO /    In time:11:04  Out time:12:10  Total Treatment Time (min): 66  Total Timed Codes (min): 66  1:1 Treatment Time (min): 66   Visit #: 2 of 4-8     Treatment Area: Chronic upper back pain [M54.9, G89.29]    SUBJECTIVE  Pain Level (0-10 scale): 3  Any medication changes, allergies to medications, adverse drug reactions, diagnosis change, or new procedure performed?: [x]  No    []  Yes (see summary sheet for update)  Subjective functional status/changes:   []  No changes reported  Felt good for a week after needling and then things flared up again.   The push-up plus seems to irritate things up in the upper trap.     OBJECTIVE      41 min Therapeutic Exercise:  [x]  See flow sheet : mcKenzie progression for C/S and T/S    Rationale: increase ROM, increase strength and improve coordination to improve the patient???s ability to bike, sit       25 min Manual Therapy:    IMT followed by DTM to mm needled, PA glides to T/S in prone Technique:      [x]  S/DTM [] IASTM [x] PROM []  Passive Stretching   [x] manual TPR  [x]  TDN (see objective; actual needle insertion time not billed)  [x] Jt manipulation:Gr I []  II []   III []  IV[x]  V[]   Treatment/Area:     Rationale:      decrease pain, increase ROM, increase tissue extensibility and decrease trigger points to improve patient's ability to bike, sit, work     Dry Needling Procedure Note    Dry Needle Session Number:  2    Procedure:   An intramuscular manual therapy (dry needling) and a neuro-muscular re-education treatment was done to deactivate myofascial trigger points, with a 15/30 gauge solid filament needle, under aseptic technique.    Indication(s): [x]  Muscle spasms [x]  Myalgia/Myositis  [x]  Muscle cramps      [x]  Muscle imbalances []  TMD (TMJ) [x]  Myofascial pain & dysfunction     []  Other: __     Chart reviewed for the following:  I, Darlyn ReadHeather M Tarissa Kerin, PT, have reviewed the medical history, summary list and precautions/contraindications for Cameron Price.    TIME OUT performed immediately prior to start of procedure:  11:30 (enter time the timeout was completed)  I, Darlyn ReadHeather M Dick Hark, PT, have performed the following reviews on Cameron Price prior to the start of the session:      [x]  Patient was identified by name and date of birth    [x]  Agreement on all muscles being treated was verified   [x]  Purpose of dry needling, side effects, possible complications, risks and benefits were explained to the patient   [x]  Procedure site(s) verified  [x]  Patient was positioned for comfort and draped for privacy  [x]  Informed Consent was signed (initial visit) and verified verbally (subsequent visits)  [x]  Patient was instructed on the need to report the use of blood thinners and/or immunosuppressant medications  [x]  How to respond to possible adverse effects of treatment  [x]  Self treatment of post needling soreness: ice, heat (moist heat, heat wraps) and stretching  [x]  Opportunity was given to ask any questions, all questions were answered            Treatment:  The following muscles were treated today:    Right: Rhomboid, mid trap, Levator   Left:      Patient???s response to today???s treatment:   [x]   LTR???s  [x]   Muscle Relaxation  [x]   Pain Relief  []   Decreased Tinnitus  []   Decreased HA???s [x]   Post needling soreness [x]   Increased ROM   []   Other:        x min Patient Education: [x]  Review HEP    [x]  Progressed/Changed HEP based on:   C/S RREIS with rotation (pt OP);  T/S Repeated R rotations  Box walks A/P and med/lateral      []  positioning   []  body mechanics   []  transfers   []  heat/ice application        Other Objective/Functional Measures:   C/S retraction extension: NE, increased ROm vs eval  T/S extension : limited  T/S R rotation: NE  Repeated R rotation: NE  Repeated R rotation with PT OP: NE   Whips: increased LS pain       Pain Level (0-10 scale) post treatment: 0    ASSESSMENT/Changes in Function: Pt has a decrease in R palpable TrP in the Rhomoboid after MT today. Min hemangioma noted, resolved with STM and DTm/light edema massage. Pt with restrictions in T/S joint mobility throughout, especially in the upper T/S. No worse with repeated T?S movements, but will address with HEP to determine if there is a positive effect/ centralization of sxs with use of repeated movements.       Patient will continue to benefit from skilled PT services to modify and progress therapeutic interventions, address functional mobility deficits, address ROM deficits, address strength deficits, analyze and address soft tissue restrictions, analyze and cue movement patterns, analyze and modify body mechanics/ergonomics, assess and modify postural abnormalities and instruct in home and community integration to attain remaining goals.     []   See Plan of Care  []   See progress note/recertification  []   See Discharge Summary         Progress towards goals / Updated goals:  ?? Short Term Goals: To be accomplished in  3  treatments:  1. Pt will be independent and compliant with HEP to decrease pain, increase ROM and return pt to PLOF.    2. Pt will note a decrease in TrP in the R rhomboid by > or = 50% to improve ADLs and cycling.  Decreased by 100% today per pt and therapist report/palpation (03/20/18)  ?? 3.Long Term Goals: To be accomplished in  4-8  treatments:  1. Pt will increase score on the FOTO to > or = 77/100 to demo an increase in functional activity tolerance.  2. Pt will have WNL, pain-free AROM of the cervical spine in all planes to increase safety with driving, ADL???s and self-care.  Progressed to C/S RREIS with pt OP (03/20/18)   3. Pt will note < or = 2/10 pain with all mobility to improve comfort with ADL???s.  Decreased pain to 3/10 at start of visit and 0/10 after (03/20/18)   4. Pt will be able to bike for > or = 1 hour with no increase in pain > or = 2/10 as managed with TE and postural changes to improve function.     PLAN  [x]   Upgrade activities as tolerated     [x]   Continue plan of care  []   Update interventions per flow sheet       []   Discharge  due to:_  [x]   Other:_  Assess rhomboid TrP, response to McKenzie proression   Ma Rings, DPT 03/20/2018  8:14 AM

## 2018-03-27 ENCOUNTER — Inpatient Hospital Stay: Payer: PRIVATE HEALTH INSURANCE | Primary: Student in an Organized Health Care Education/Training Program

## 2018-04-04 ENCOUNTER — Encounter: Payer: PRIVATE HEALTH INSURANCE | Primary: Student in an Organized Health Care Education/Training Program

## 2018-04-06 ENCOUNTER — Inpatient Hospital Stay
Admit: 2018-04-06 | Payer: PRIVATE HEALTH INSURANCE | Primary: Student in an Organized Health Care Education/Training Program

## 2018-04-06 DIAGNOSIS — M549 Dorsalgia, unspecified: Secondary | ICD-10-CM

## 2018-04-06 NOTE — Progress Notes (Signed)
 [facility] AT [facility]   [address] [place], [place] [address]  Phone: [phone] Fax: [phone]  PROGRESS NOTE  Patient Name: Cameron Price DOB: 1970-06-09   Treatment/Medical Diagnosis: Chronic upper back pain [M54.9, G89.29]   Referral Source: Ezzie Sheena BROCKS, MD     Date of Initial Visit: 03/06/18 Attended Visits: 3 Missed Visits: 1     SUMMARY OF TREATMENT   Pt is a 48yo male with chronic R medial scapular pain. Notes a hx of pain from lots of cycling, prior to and then worsened (?) after clavicular fx.  Ther ex including strengthening, ROM, flexibility, stabilization; manual therapy including: dry needling (3 sessions), joint mobilizations, DTM, TrP release; postural education, repeated movements;  Patient education; HEP  CURRENT STATUS  Pt is making good progress in therapy for chronic c/o pain. Pain ranges from 0-5/10, ave 3/10, and pt rates 50% improvement. Score on the FOTO  Has maintained 72/100.      FOTO: remains 72/100  Pain ranges: 0-5/10 , ave pain 3/10   Subjective improvements: less pain, more upper body strength   Deficits: inconsistency with pain and movement, soreness after working out ; tightness in the R UT limiting L rotation     C/S AROM: flexionchin to chest, extension50%, LSB34 degrees, RSB36 degrees, LRotation70 deg with painRRotation 70deg with pain.  T/S AROm: rotation 75%   Palpation: TTP and TrP with twitch responses in the R Rhomboid minor and major, mid trapezius, TTP in the LS but no TrP.Decreased joint mobility throughout the T/S and C/S  PostureFHRS.   GHJ AROM isWNL all planes with improved winging of the scaspula, just at end range eccentric abd and flexion for the (L)   GHJ MMT: all 5/5. Lower trap R 4/5, L 4+/5, mid trap 4+/5 (B).       Goals for this period include:   Short Term Goals:To be accomplished in 3treatments:  1. Pt will be independent and compliant with HEP to decrease pain, increase ROM and  return pt to PLOF.   2.Pt will note a decrease in TrP in the R rhomboid by > or = 50% to improve ADLs and cycling.Decreased by 50% per pt and therapist report (04/06/18)   3.Long Term Goals:To be accomplished in 4-8treatments:  1. Pt will increase score on the FOTO to > or =77/100 to demo an increase in functional activity tolerance. Not progressing; maintains 72/100 (04/06/18)  2. Pt will haveWNL, pain-free AROM of the cervical spine in all planes to increase safety with driving, ADL's and self-care.Progressed in rotation, SB, extension (04/06/18)  3. Pt will note < or = 2/10 pain with all mobility to improve comfort with ADL's.Goal progressing; Pain ranges 0-5/10 and ave 3/10 (04/06/18)  4.Pt will be able to bike for > or = 1 hour with no increase in pain > or = 2/10 as managed with TE and postural changes to improve function. Pt has not biked lately due to weather (04/06/18)      New Goals to be achieved in __2-4__  treatments:  1. Pt will increase score on the FOTO to > or =77/100 to demo an increase in functional activity tolerance.  2. Pt will haveWNL, pain-free AROM of the cervical spine in all planes to increase safety with driving, ADL's and self-care.  3. Pt will note < or = 2/10 pain with all mobility to improve comfort with ADL's.  4.Pt will be able to  bike for > or = 1 hour with no increase in pain > or = 2/10 as managed with TE and postural changes to improve function.     G-Codes: na  RECOMMENDATIONS  con't at 2-4x/month for Dry needling, strength work   If you have any questions/comments please contact us  directly at [phone].   Thank you for allowing us  to assist in the care of your patient.  Therapist Signature: Powell Shin, DPT Date: 04/06/2018     Time: 1:40 PM   NOTE TO PHYSICIAN:  PLEASE COMPLETE THE ORDERS BELOW AND FAX TO   [facility]: [phone].  If you are unable to process this request in 24 hours please contact our office: [phone]  [phone].  ___ I have read the above report and request that my patient continue as recommended.   ___ I have read the above report and request that my patient continue therapy with the following changes/special instructions:_________________________________________________________   ___ I have read the above report and request that my patient be discharged from therapy.     Physician Signature:        Date:       Time:

## 2018-04-06 NOTE — Progress Notes (Signed)
 PT DAILY TREATMENT NOTE 8-14    Patient Name: Cameron Price  Date:04/06/2018  DOB: [birth year]  [x]   Patient DOB Verified  Payor: HULAN / Plan: [employer] PPO / Product Type: PPO /    In time:4:34  Out time:5:20  Total Treatment Time (min): 46  Total Timed Codes (min): 46  1:1 Treatment Time (min): 46   Visit #: 3 of 4-8    Treatment Area: Chronic upper back pain [M54.9, G89.29]    SUBJECTIVE  Pain Level (0-10 scale): 2  Any medication changes, allergies to medications, adverse drug reactions, diagnosis change, or new procedure performed?: [x]  No    []  Yes (see summary sheet for update)  Subjective functional status/changes:   []  No changes reported  Just a little bit of pain. After I do exercises I'm sore from the gym. But it's not today.     OBJECTIVE      16 min Therapeutic Exercise:  [x]  See flow sheet : reassessment performed with pt and FOTO with pt ; progressed HEP   Rationale: increase ROM, increase strength and improve coordination to improve the patient's ability to reach, bike, ADLs       30 min Manual Therapy:    IMT followed by DTM to mm needled, PA glides to T/S in prone Technique:      [x]  S/DTM [] IASTM [] PROM []  Passive Stretching   [x] manual TPR  [x]  TDN (see objective; actual needle insertion time not billed)  [x] Jt manipulation:Gr I []  II []   III []  IV[x]  V[x]   Treatment/Area:     Rationale:      decrease pain, increase ROM, increase tissue extensibility and decrease trigger points to improve patient's ability to bike, reach, ADLs, sport, sitting     Dry Needling Procedure Note    Dry Needle Session Number:  3    Procedure:   An intramuscular manual therapy (dry needling) and a neuro-muscular re-education treatment was done to deactivate myofascial trigger points, with a 15/30 gauge solid filament needle, under aseptic technique.    Indication(s): [x]  Muscle spasms [x]  Myalgia/Myositis  [x]  Muscle cramps      [x]  Muscle imbalances []  TMD (TMJ) [x]  Myofascial pain & dysfunction     []  Other: __    Chart  reviewed for the following:  I, [name], PT, have reviewed the medical history, summary list and precautions/contraindications for Cameron Price.    TIME OUT performed immediately prior to start of procedure:  4:50  (enter time the timeout was completed)  I, [name], PT, have performed the following reviews on Cameron Price prior to the start of the session:      [x]  Patient was identified by name and date of birth    [x]  Agreement on all muscles being treated was verified   [x]  Purpose of dry needling, side effects, possible complications, risks and benefits were explained to the patient   [x]  Procedure site(s) verified  [x]  Patient was positioned for comfort and draped for privacy  [x]  Informed Consent was signed (initial visit) and verified verbally (subsequent visits)  [x]  Patient was instructed on the need to report the use of blood thinners and/or immunosuppressant medications  [x]  How to respond to possible adverse effects of treatment  [x]  Self treatment of post needling soreness: ice, heat (moist heat, heat wraps) and stretching  [x]  Opportunity was given to ask any questions, all questions were answered            Treatment:  The following muscles were treated today:    Right: Rhomboid, mid trap, Levator scapula, T/S   Left:      Patient's response to today's treatment:   [x]   LTR's  [x]   Muscle Relaxation  []   Pain Relief  []   Decreased Tinnitus  []   Decreased HA's [x]   Post needling soreness []   Increased ROM   []   Other:        x min Patient Education: [x]  Review HEP    [x]  Progressed/Changed HEP based on:  Scissor planks to work SA in lower ranges of GHJ elevation.   []  positioning   []  body mechanics   []  transfers   []  heat/ice application        Other Objective/Functional Measures:   FOTO: 72/100  Pain ranges: 0-5/10 , ave pain 3/10   Subjective improvements: less pain, more upper body strength   Deficits: inconsistency with pain and movement, soreness after working out ; tightness in  the R UT limiting L rotation     C/S AROM: flexion chin to chest, extension 50%, LSB 34 degrees, RSB 36 degrees, LRotation 70 deg with pain RRotation 70deg with pain.   T/S AROm: rotation 75%    Palpation: TTP and TrP with twitch responses in the R Rhomboid minor and major, mid trapezius, TTP in the LS but no TrP.   Decreased joint mobility throughout the T/S and C/S   Posture FHRS.   GHJ AROM is WNL all planes with improved winging of the scaspula, just at end range eccentric abd and flexion for the (L)   GHJ MMT: all 5/5. Lower trap R 4/5, L 4+/5, mid trap 4+/5 (B).       Pain Level (0-10 scale) post treatment: 0    ASSESSMENT/Changes in Function: see PN     Patient will continue to benefit from skilled PT services to modify and progress therapeutic interventions, address functional mobility deficits, address ROM deficits, address strength deficits, analyze and address soft tissue restrictions, analyze and cue movement patterns, analyze and modify body mechanics/ergonomics, assess and modify postural abnormalities, address imbalance/dizziness and instruct in home and community integration to attain remaining goals.     []   See Plan of Care  []   See progress note/recertification  []   See Discharge Summary         Progress towards goals / Updated goals:   Short Term Goals:To be accomplished in 3treatments:  1. Pt will be independent and compliant with HEP to decrease pain, increase ROM and return pt to PLOF.   2.Pt will note a decrease in TrP in the R rhomboid by > or = 50% to improve ADLs and cycling. Decreased by 50% per pt and therapist report (04/06/18)   3.Long Term Goals:To be accomplished in 4-8treatments:  1. Pt will increase score on the FOTO to > or =77/100 to demo an increase in functional activity tolerance. Not progressing; maintains 72/100 (04/06/18)  2. Pt will haveWNL, pain-free AROM of the cervical spine in all planes to increase safety with driving, ADL's and self-care.  Progressed in  rotation, SB, extension (04/06/18)  3. Pt will note < or = 2/10 pain with all mobility to improve comfort with ADL's.  Goal progressing; Pain ranges 0-5/10 and ave 3/10 (04/06/18)  4.Pt will be able to bike for > or = 1 hour with no increase in pain > or = 2/10 as managed with TE and postural changes to improve function. Pt has not biked lately due  to weather (04/06/18)    PLAN  [x]   Upgrade activities as tolerated     [x]   Continue plan of care  []   Update interventions per flow sheet       []   Discharge due to:_  [x]   Other:_  con't at 2-4x/month for Dry needling, strength work     Powell Shin, DPT 04/06/2018  1:38 PM

## 2018-04-06 NOTE — Progress Notes (Signed)
[facility] AT [facility]   [address] [place], [place] [address]  Phone: [phone] Fax: [phone]  PROGRESS NOTE  Patient Name: Cameron Price DOB: 1970-11-26   Treatment/Medical Diagnosis: Chronic upper back pain [M54.9, G89.29]   Referral Source: Ria Bush, MD     Date of Initial Visit: 03/06/18 Attended Visits: 3 Missed Visits: 1     SUMMARY OF TREATMENT   Pt is a 48yo male with chronic R medial scapular pain. Notes a hx of pain from lots of cycling, prior to and then worsened (?) after clavicular fx.  Ther ex including strengthening, ROM, flexibility, stabilization; manual therapy including: dry needling (3 sessions), joint mobilizations, DTM, TrP release; postural education, repeated movements;  Patient education; HEP  CURRENT STATUS  Pt is making good progress in therapy for chronic c/o pain. Pain ranges from 0-5/10, ave 3/10, and pt rates 50% improvement. Score on the FOTO  Has maintained 72/100.      FOTO: remains 72/100  Pain ranges: 0-5/10 , ave pain 3/10   Subjective improvements: less pain, more upper body strength   Deficits: inconsistency with pain and movement, soreness after working out ; tightness in the R UT limiting L rotation   ??  C/S AROM: flexion??chin to chest, extension??50%, LSB??34 degrees, RSB??36 degrees, LRotation??70 deg with pain??RRotation 70deg with pain.??  T/S AROm: rotation 75%   ??Palpation: TTP and TrP with twitch responses in the R Rhomboid minor and major, mid trapezius, TTP in the LS but no TrP.??????Decreased joint mobility throughout the T/S and C/S??  Posture??FHRS.   GHJ AROM is??WNL all planes with improved winging of the scaspula, just at end range eccentric abd and flexion for the (L)   GHJ MMT: all 5/5. Lower trap R 4/5, L 4+/5, mid trap 4+/5 (B).       Goals for this period include:  ?? Short Term Goals:??To be accomplished in ??3????treatments:  1. Pt will be independent and compliant with HEP to decrease pain,  increase ROM and return pt to PLOF. ??  2.??Pt will note a decrease in TrP in the R rhomboid by > or = 50% to improve ADLs and cycling.????Decreased by 50% per pt and therapist report (04/06/18)  ?? 3.Long Term Goals:??To be accomplished in ??4-8????treatments:  1. Pt will increase score on the FOTO to > or =??77/100 to demo an increase in functional activity tolerance. Not progressing; maintains 72/100 (04/06/18)  2. Pt will have??WNL, pain-free AROM of the cervical spine in all planes to increase safety with driving, ADL???s and self-care.????Progressed in rotation, SB, extension (04/06/18)  3. Pt will note < or = 2/10 pain with all mobility to improve comfort with ADL???s.????Goal progressing; Pain ranges 0-5/10 and ave 3/10 (04/06/18)  4.??Pt will be able to bike for > or = 1 hour with no increase in pain > or = 2/10 as managed with TE and postural changes to improve function.?? Pt has not biked lately due to weather (04/06/18)      New Goals to be achieved in __2-4__  treatments:  1. Pt will increase score on the FOTO to > or =??77/100 to demo an increase in functional activity tolerance.  2. Pt will have??WNL, pain-free AROM of the cervical spine in all planes to increase safety with driving, ADL???s and self-care.????  3. Pt will note < or = 2/10 pain with all mobility to improve comfort with ADL???s.????  4.??Pt will be able to  bike for > or = 1 hour with no increase in pain > or = 2/10 as managed with TE and postural changes to improve function.??     G-Codes: na  RECOMMENDATIONS  con't at 2-4x/month for Dry needling, strength work   If you have any questions/comments please contact us directly at [phone].   Thank you for allowing [place] to assist in the care of your patient.  Therapist Signature: Ma Rings, DPT Date: 04/06/2018     Time: 1:40 PM   NOTE TO PHYSICIAN:  PLEASE COMPLETE THE ORDERS BELOW AND FAX TO   [facility]: [phone].  If you are unable to process this request in 24 hours please contact our  office: [phone].  ___ I have read the above report and request that my patient continue as recommended.   ___ I have read the above report and request that my patient continue therapy with the following changes/special instructions:_________________________________________________________   ___ I have read the above report and request that my patient be discharged from therapy.     Physician Signature:        Date:       Time:

## 2018-04-06 NOTE — Progress Notes (Signed)
PT DAILY TREATMENT NOTE 8-14    Patient Name: Cameron Price  Date:04/06/2018  DOB: March 29, 1971  [x]   Patient DOB Verified  Payor: Monia Pouch / Plan: [employer] PPO / Product Type: PPO /    In time:4:34  Out time:5:20  Total Treatment Time (min): 46  Total Timed Codes (min): 46  1:1 Treatment Time (min): 46   Visit #: 3 of 4-8    Treatment Area: Chronic upper back pain [M54.9, G89.29]    SUBJECTIVE  Pain Level (0-10 scale): 2  Any medication changes, allergies to medications, adverse drug reactions, diagnosis change, or new procedure performed?: [x]  No    []  Yes (see summary sheet for update)  Subjective functional status/changes:   []  No changes reported  Just a little bit of pain. After I do exercises I'm sore from the gym. But it's not today.     OBJECTIVE      16 min Therapeutic Exercise:  [x]  See flow sheet : reassessment performed with pt and FOTO with pt ; progressed HEP   Rationale: increase ROM, increase strength and improve coordination to improve the patient???s ability to reach, bike, ADLs       30 min Manual Therapy:    IMT followed by DTM to mm needled, PA glides to T/S in prone Technique:      [x]  S/DTM [] IASTM [] PROM []  Passive Stretching   [x] manual TPR  [x]  TDN (see objective; actual needle insertion time not billed)  [x] Jt manipulation:Gr I []  II []   III []  IV[x]  V[x]   Treatment/Area:     Rationale:      decrease pain, increase ROM, increase tissue extensibility and decrease trigger points to improve patient's ability to bike, reach, ADLs, sport, sitting     Dry Needling Procedure Note    Dry Needle Session Number:  3    Procedure:   An intramuscular manual therapy (dry needling) and a neuro-muscular re-education treatment was done to deactivate myofascial trigger points, with a 15/30 gauge solid filament needle, under aseptic technique.    Indication(s): [x]  Muscle spasms [x]  Myalgia/Myositis  [x]  Muscle cramps      [x]  Muscle imbalances []  TMD (TMJ) [x]  Myofascial pain & dysfunction     []  Other: __     Chart reviewed for the following:  I, Darlyn Read, PT, have reviewed the medical history, summary list and precautions/contraindications for Cameron Price.    TIME OUT performed immediately prior to start of procedure:  4:50  (enter time the timeout was completed)  I, Darlyn Read, PT, have performed the following reviews on Cameron Price prior to the start of the session:      [x]  Patient was identified by name and date of birth    [x]  Agreement on all muscles being treated was verified   [x]  Purpose of dry needling, side effects, possible complications, risks and benefits were explained to the patient   [x]  Procedure site(s) verified  [x]  Patient was positioned for comfort and draped for privacy  [x]  Informed Consent was signed (initial visit) and verified verbally (subsequent visits)  [x]  Patient was instructed on the need to report the use of blood thinners and/or immunosuppressant medications  [x]  How to respond to possible adverse effects of treatment  [x]  Self treatment of post needling soreness: ice, heat (moist heat, heat wraps) and stretching  [x]  Opportunity was given to ask any questions, all questions were answered            Treatment:  The following muscles were treated today:    Right: Rhomboid, mid trap, Levator scapula, T/S   Left:      Patient???s response to today???s treatment:   [x]   LTR???s  [x]   Muscle Relaxation  []   Pain Relief  []   Decreased Tinnitus  []   Decreased HA???s [x]   Post needling soreness []   Increased ROM   []   Other:        x min Patient Education: [x]  Review HEP    [x]  Progressed/Changed HEP based on:  Scissor planks to work SA in lower ranges of GHJ elevation.   []  positioning   []  body mechanics   []  transfers   []  heat/ice application        Other Objective/Functional Measures:   FOTO: 72/100  Pain ranges: 0-5/10 , ave pain 3/10   Subjective improvements: less pain, more upper body strength   Deficits: inconsistency with pain and movement, soreness after working out  ; tightness in the R UT limiting L rotation     C/S AROM: flexion chin to chest, extension 50%, LSB 34 degrees, RSB 36 degrees, LRotation 70 deg with pain RRotation 70deg with pain.   T/S AROm: rotation 75%    Palpation: TTP and TrP with twitch responses in the R Rhomboid minor and major, mid trapezius, TTP in the LS but no TrP.   Decreased joint mobility throughout the T/S and C/S   Posture FHRS.   GHJ AROM is WNL all planes with improved winging of the scaspula, just at end range eccentric abd and flexion for the (L)   GHJ MMT: all 5/5. Lower trap R 4/5, L 4+/5, mid trap 4+/5 (B).       Pain Level (0-10 scale) post treatment: 0    ASSESSMENT/Changes in Function: see PN     Patient will continue to benefit from skilled PT services to modify and progress therapeutic interventions, address functional mobility deficits, address ROM deficits, address strength deficits, analyze and address soft tissue restrictions, analyze and cue movement patterns, analyze and modify body mechanics/ergonomics, assess and modify postural abnormalities, address imbalance/dizziness and instruct in home and community integration to attain remaining goals.     []   See Plan of Care  []   See progress note/recertification  []   See Discharge Summary         Progress towards goals / Updated goals:  ?? Short Term Goals:??To be accomplished in ??3????treatments:  1. Pt will be independent and compliant with HEP to decrease pain, increase ROM and return pt to PLOF. ??  2.??Pt will note a decrease in TrP in the R rhomboid by > or = 50% to improve ADLs and cycling.?? Decreased by 50% per pt and therapist report (04/06/18)  ?? 3.Long Term Goals:??To be accomplished in ??4-8????treatments:  1. Pt will increase score on the FOTO to > or =??77/100 to demo an increase in functional activity tolerance. Not progressing; maintains 72/100 (04/06/18)  2. Pt will have??WNL, pain-free AROM of the cervical spine in all planes to  increase safety with driving, ADL???s and self-care.  Progressed in rotation, SB, extension (04/06/18)  3. Pt will note < or = 2/10 pain with all mobility to improve comfort with ADL???s.  Goal progressing; Pain ranges 0-5/10 and ave 3/10 (04/06/18)  4.??Pt will be able to bike for > or = 1 hour with no increase in pain > or = 2/10 as managed with TE and postural changes to improve function.?? Pt has not biked lately due  to weather (04/06/18)    PLAN  [x]   Upgrade activities as tolerated     [x]   Continue plan of care  []   Update interventions per flow sheet       []   Discharge due to:_  [x]   Other:_  con't at 2-4x/month for Dry needling, strength work     Ma Rings, DPT 04/06/2018  1:38 PM

## 2018-04-24 ENCOUNTER — Inpatient Hospital Stay
Admit: 2018-04-24 | Payer: PRIVATE HEALTH INSURANCE | Primary: Student in an Organized Health Care Education/Training Program

## 2018-04-24 NOTE — Progress Notes (Signed)
PT DAILY TREATMENT NOTE 8-14    Patient Name: Cameron Price  Date:04/24/2018  DOB: [birth year]  [x]   Patient DOB Verified  Payor: Monia Pouch / Plan: [employer] PPO / Product Type: PPO /    In time:10:28  Out time:11:12  Total Treatment Time (min): 44  Total Timed Codes (min): 44  1:1 Treatment Time (min): 44   Visit #: 1 of 2-4    Treatment Area: Chronic upper back pain [M54.9, G89.29]    SUBJECTIVE  Pain Level (0-10 scale): 3.5  Any medication changes, allergies to medications, adverse drug reactions, diagnosis change, or new procedure performed?: [x]  No    []  Yes (see summary sheet for update)  Subjective functional status/changes:   []  No changes reported  Things are overall improving. Things are pretty good. The pain is in the morning but no throughout the day.      OBJECTIVE    28 min Therapeutic Exercise:  [x]  See flow sheet :    Rationale: increase ROM, increase strength and improve coordination to improve the patient's ability to reach, bike, ADLs        20 min Manual Therapy:    IMT followed by DTM to mm needled, PA glides to T/S in prone, ,   Seated T/S rotation with PT OP  Technique:      [x]  S/DTM [] IASTM [x] PROM [x]  Passive Stretching   [] manual TPR  [x]  TDN (see objective; actual needle insertion time not billed)  [x] Jt manipulation:Gr I []  II []   III []  IV[x]  V[]   Treatment/Area:     Rationale:      decrease pain, increase ROM, increase tissue extensibility and decrease trigger points to improve patient's ability to reach, bike, ADLs, sit for work, sports    Dry Needling Procedure Note    Dry Needle Session Number:  4    Procedure:   An intramuscular manual therapy (dry needling) and a neuro-muscular re-education treatment was done to deactivate myofascial trigger points, with a 15/30 gauge solid filament needle, under aseptic technique.    Indication(s): [x]  Muscle spasms [x]  Myalgia/Myositis  []  Muscle cramps      []  Muscle imbalances []  TMD (TMJ) [x]  Myofascial pain & dysfunction     []  Other: __    Chart  reviewed for the following:  I, Darlyn Read, PT, have reviewed the medical history, summary list and precautions/contraindications for [name].    TIME OUT performed immediately prior to start of procedure:  10:48  (enter time the timeout was completed)  I, Darlyn Read, PT, have performed the following reviews on Heiko Staudt prior to the start of the session:      [x]  Patient was identified by name and date of birth    [x]  Agreement on all muscles being treated was verified   [x]  Purpose of dry needling, side effects, possible complications, risks and benefits were explained to the patient   [x]  Procedure site(s) verified  [x]  Patient was positioned for comfort and draped for privacy  [x]  Informed Consent was signed (initial visit) and verified verbally (subsequent visits)  [x]  Patient was instructed on the need to report the use of blood thinners and/or immunosuppressant medications  [x]  How to respond to possible adverse effects of treatment  [x]  Self treatment of post needling soreness: ice, heat (moist heat, heat wraps) and stretching  [x]  Opportunity was given to ask any questions, all questions were answered            Treatment:  The following muscles were treated today:    Right: Rhomboid major and minor, lower trap, mid trap    Left:      Patient's response to today's treatment:   [x]   LTR's  [x]   Muscle Relaxation  []   Pain Relief  []   Decreased Tinnitus  []   Decreased HA's [x]   Post needling soreness [x]   Increased ROM   []   Other:        x min Patient Education: [x]  Review HEP    []  Progressed/Changed HEP based on:    Pt to perform his HEP at night (ideally every 2 hours however), with whips, open books.   Can hold on C/S repeated retraction extension at this time to focus on R/S rotation       []  positioning   []  body mechanics   []  transfers   []  heat/ice application        Other Objective/Functional Measures:   Reviewed T/S whips (B)  T/S rotaiton with PT OP  Seated T/S rotation  Open  books (B)       Pain Level (0-10 scale) post treatment: 0    ASSESSMENT/Changes in Function: Decreased mm tightness throughout the R rhomboid. Increasing T/S rotation and extension after TE. Good response to IMT with LTR"s throughout.  Improved T/S mobility but not full yet.       Patient will continue to benefit from skilled PT services to modify and progress therapeutic interventions, address functional mobility deficits, address ROM deficits, address strength deficits, analyze and address soft tissue restrictions, analyze and cue movement patterns, analyze and modify body mechanics/ergonomics, assess and modify postural abnormalities and instruct in home and community integration to attain remaining goals.     []   See Plan of Care  []   See progress note/recertification  []   See Discharge Summary         Progress towards goals / Updated goals:  1. Pt will increase score on the FOTO to > or =77/100 to demo an increase in functional activity tolerance.  2. Pt will haveWNL, pain-free AROM of the cervical spine in all planes to increase safety with driving, ADL's and self-care.  3. Pt will note < or = 2/10 pain with all mobility to improve comfort with ADL's. Pt notes only 3.5/10 pain during the mornings only (04/24/18)  4.Pt will be able to bike for > or = 1 hour with no increase in pain > or = 2/10 as managed with TE and postural changes to improve function. Goal progressing with pt only noting morning pain (04/24/18)    PLAN  [x]   Upgrade activities as tolerated     []   Continue plan of care  []   Update interventions per flow sheet       []   Discharge due to:_  [x]   Other:_ assess response to T/S whips and morning pain      Ma Rings, DPT 04/24/2018  8:04 AM

## 2018-04-24 NOTE — Progress Notes (Signed)
 [facility]   [address], [place], [place] [address]  Phone: [phone] Fax [phone]  DISCHARGE SUMMARY  Patient Name: Cameron Price DOB: 24-Jul-1970   Treatment/Medical Diagnosis: Chronic upper back pain [M54.9, G89.29]   Referral Source: Ezzie Sheena BROCKS, MD     Date of Initial Visit: 03/06/2018 Attended Visits: 4 Missed Visits: 1     SUMMARY OF TREATMENT   Pt is a 48yo male with chronic R medial scapular pain. Notes a hx of pain from lots of cycling, prior to and then worsened (?) after clavicular fx.   Ther ex including strengthening, ROM, flexibility, stabilization; manual therapy including: dry needling, mobilizations, DTM, TrP release;  Patient education; HEP, repeated movement  CURRENT STATUS  Pt made good progress in therapy for chronic c/o pain. Pain ranges from 0-5/10, ave 3/10, and pt rates 50% improvement. Score on the FOTO  Has maintained 72/100.      FOTO:remains 72/100  Pain ranges:0-5/10 , ave pain 3/10  Subjective improvements:less pain esp in the afternoon and evenings; more upper body strength  Deficits: inconsistency with pain and movement, soreness after working out ; tightness in the R UT limiting L rotation    C/S AROM: flexionchin to chest, extension50%, LSB34 degrees, RSB36 degrees, LRotation70deg with painRRotation70deg with pain.  T/S AROm: rotation75%  Palpation: TTP and TrP with twitch responses in the R Rhomboid minor and major, mid trapezius, TTP in the LS but no TrP.Decreased joint mobility throughout the T/S and C/S  PostureFHRS.   GHJ AROM isWNL all planes withimprovedwinging of the scaspula, just at end range eccentric abd and flexion for the (L)  GHJ MMT: all 5/5. Lower trap R4/5, L 4+/5, mid trap 4+/5 (B).      Goals for this period include:    1. Pt will increase score on the FOTO to > or =77/100 to demo an increase in functional activity tolerance. Goal not met, remained 72/100  (04/24/2018)  2. Pt will haveWNL, pain-free AROM of the cervical spine in all planes to increase safety with driving, ADL's and self-care. Progressing, addressed with C/S repeated retraction extension and rotations (04/24/2018)  3. Pt will note < or = 2/10 pain with all mobility to improve comfort with ADL's. Pt notes only 3.5/10 pain during the mornings only (04/24/18)  4.Pt will be able to bike for > or = 1 hour with no increase in pain > or = 2/10 as managed with TE and postural changes to improve function. Goal progressing with pt only noting morning pain (04/24/18)    RECOMMENDATIONS  Discontinue therapy due to lack of attendance or compliance.  If you have any questions/comments please contact us  directly at [phone].   Thank you for allowing us  to assist in the care of your patient.  Therapist Signature: Powell Shin, DPT Date: 05/29/2018      Time: 8:35 AM

## 2018-04-24 NOTE — Progress Notes (Signed)
PT DAILY TREATMENT NOTE 8-14    Patient Name: Cameron Price  Date:04/24/2018  DOB: 12/20/1970  [x]   Patient DOB Verified  Payor: Monia Pouch / Plan: [employer]ET[employer] PPO / Product Type: PPO /    In time:10:28  Out time:11:12  Total Treatment Time (min): 44  Total Timed Codes (min): 44  1:1 Treatment Time (min): 44   Visit #: 1 of 2-4    Treatment Area: Chronic upper back pain [M54.9, G89.29]    SUBJECTIVE  Pain Level (0-10 scale): 3.5  Any medication changes, allergies to medications, adverse drug reactions, diagnosis change, or new procedure performed?: [x]  No    []  Yes (see summary sheet for update)  Subjective functional status/changes:   []  No changes reported  Things are overall improving. Things are pretty good. The pain is in the morning but no throughout the day.      OBJECTIVE    28 min Therapeutic Exercise:  [x]  See flow sheet :    Rationale: increase ROM, increase strength and improve coordination to improve the patient???s ability to reach, bike, ADLs        20 min Manual Therapy:    IMT followed by DTM to mm needled, PA glides to T/S in prone, ,   Seated T/S rotation with PT OP  Technique:      [x]  S/DTM [] IASTM [x] PROM [x]  Passive Stretching   [] manual TPR  [x]  TDN (see objective; actual needle insertion time not billed)  [x] Jt manipulation:Gr I []  II []   III []  IV[x]  V[]   Treatment/Area:     Rationale:      decrease pain, increase ROM, increase tissue extensibility and decrease trigger points to improve patient's ability to reach, bike, ADLs, sit for work, sports    Dry Needling Procedure Note    Dry Needle Session Number:  4    Procedure:   An intramuscular manual therapy (dry needling) and a neuro-muscular re-education treatment was done to deactivate myofascial trigger points, with a 15/30 gauge solid filament needle, under aseptic technique.    Indication(s): [x]  Muscle spasms [x]  Myalgia/Myositis  []  Muscle cramps      []  Muscle imbalances []  TMD (TMJ) [x]  Myofascial pain & dysfunction     []  Other: __     Chart reviewed for the following:  I, Darlyn Read, PT, have reviewed the medical history, summary list and precautions/contraindications for Cameron Price.    TIME OUT performed immediately prior to start of procedure:  10:48  (enter time the timeout was completed)  I, Darlyn Read, PT, have performed the following reviews on Cameron Price prior to the start of the session:      [x]  Patient was identified by name and date of birth    [x]  Agreement on all muscles being treated was verified   [x]  Purpose of dry needling, side effects, possible complications, risks and benefits were explained to the patient   [x]  Procedure site(s) verified  [x]  Patient was positioned for comfort and draped for privacy  [x]  Informed Consent was signed (initial visit) and verified verbally (subsequent visits)  [x]  Patient was instructed on the need to report the use of blood thinners and/or immunosuppressant medications  [x]  How to respond to possible adverse effects of treatment  [x]  Self treatment of post needling soreness: ice, heat (moist heat, heat wraps) and stretching  [x]  Opportunity was given to ask any questions, all questions were answered            Treatment:  The following muscles were treated today:    Right: Rhomboid major and minor, lower trap, mid trap    Left:      Patient???s response to today???s treatment:   [x]   LTR???s  [x]   Muscle Relaxation  []   Pain Relief  []   Decreased Tinnitus  []   Decreased HA???s [x]   Post needling soreness [x]   Increased ROM   []   Other:        x min Patient Education: [x]  Review HEP    []  Progressed/Changed HEP based on:    Pt to perform his HEP at night (ideally every 2 hours however), with whips, open books.   Can hold on C/S repeated retraction extension at this time to focus on R/S rotation       []  positioning   []  body mechanics   []  transfers   []  heat/ice application        Other Objective/Functional Measures:   Reviewed T/S whips (B)  T/S rotaiton with PT OP  Seated T/S rotation   Open books (B)       Pain Level (0-10 scale) post treatment: 0    ASSESSMENT/Changes in Function: Decreased mm tightness throughout the R rhomboid. Increasing T/S rotation and extension after TE. Good response to IMT with LTR"s throughout.  Improved T/S mobility but not full yet.       Patient will continue to benefit from skilled PT services to modify and progress therapeutic interventions, address functional mobility deficits, address ROM deficits, address strength deficits, analyze and address soft tissue restrictions, analyze and cue movement patterns, analyze and modify body mechanics/ergonomics, assess and modify postural abnormalities and instruct in home and community integration to attain remaining goals.     []   See Plan of Care  []   See progress note/recertification  []   See Discharge Summary         Progress towards goals / Updated goals:  1. Pt will increase score on the FOTO to > or =??77/100 to demo an increase in functional activity tolerance.  2. Pt will have??WNL, pain-free AROM of the cervical spine in all planes to increase safety with driving, ADL???s and self-care.????  3. Pt will note < or = 2/10 pain with all mobility to improve comfort with ADL???s.???? Pt notes only 3.5/10 pain during the mornings only (04/24/18)  4.??Pt will be able to bike for > or = 1 hour with no increase in pain > or = 2/10 as managed with TE and postural changes to improve function. Goal progressing with pt only noting morning pain (04/24/18)??    PLAN  [x]   Upgrade activities as tolerated     []   Continue plan of care  []   Update interventions per flow sheet       []   Discharge due to:_  [x]   Other:_ assess response to T/S whips and morning pain      Ma Rings, DPT 04/24/2018  8:04 AM

## 2018-04-24 NOTE — Progress Notes (Signed)
[facility]   [address] [place], [place] [address]  Phone: [phone] Fax [phone]  DISCHARGE SUMMARY  Patient Name: Cameron Price DOB: [birth year]   Treatment/Medical Diagnosis: Chronic upper back pain [M54.9, G89.29]   Referral Source: Larae Grooms, MD     Date of Initial Visit: 03/06/2018 Attended Visits: 4 Missed Visits: 1     SUMMARY OF TREATMENT   Pt is a 48yo male with chronic R medial scapular pain. Notes a hx of pain from lots of cycling, prior to and then worsened (?) after clavicular fx.   Ther ex including strengthening, ROM, flexibility, stabilization; manual therapy including: dry needling, mobilizations, DTM, TrP release;  Patient education; HEP, repeated movement  CURRENT STATUS  Pt made good progress in therapy for chronic c/o pain. Pain ranges from 0-5/10, ave 3/10, and pt rates 50% improvement. Score on the FOTO  Has maintained 72/100.    ??  FOTO:??remains 72/100  Pain ranges:??0-5/10 , ave pain 3/10??  Subjective improvements:??less pain esp in the afternoon and evenings; more upper body strength??  Deficits: inconsistency with pain and movement, soreness after working out ; tightness in the R UT limiting L rotation??  ??  C/S AROM: flexion??chin to chest, extension??50%, LSB??34 degrees, RSB??36 degrees, LRotation??70??deg with pain??RRotation??70deg with pain.??  T/S AROm: rotation??75%??  ??Palpation: TTP and TrP with twitch responses in the R Rhomboid minor and major, mid trapezius, TTP in the LS but no TrP.??????Decreased joint mobility throughout the T/S and C/S??  Posture??FHRS.   GHJ AROM is??WNL all planes with??improved??winging of the scaspula, just at end range eccentric abd and flexion for the (L)??  GHJ MMT: all 5/5. Lower trap R??4/5, L 4+/5, mid trap 4+/5 (B).??  ??    Goals for this period include:    1. Pt will increase score on the FOTO to > or =??77/100 to demo an increase in functional activity tolerance. Goal not met, remained 72/100  (04/24/2018)  2. Pt will have??WNL, pain-free AROM of the cervical spine in all planes to increase safety with driving, ADL???s and self-care.???? Progressing, addressed with C/S repeated retraction extension and rotations (04/24/2018)  3. Pt will note < or = 2/10 pain with all mobility to improve comfort with ADL???s.???? Pt notes only 3.5/10 pain during the mornings only (04/24/18)  4.??Pt will be able to bike for > or = 1 hour with no increase in pain > or = 2/10 as managed with TE and postural changes to improve function. Goal progressing with pt only noting morning pain (04/24/18)??    RECOMMENDATIONS  Discontinue therapy due to lack of attendance or compliance.  If you have any questions/comments please contact us directly at [phone].   Thank you for allowing [place] to assist in the care of your patient.  Therapist Signature: Sidney Ace, DPT Date: 05/29/2018      Time: 8:35 AM
